# Patient Record
Sex: Female | Born: 1975 | Race: White | Hispanic: No | Marital: Married | Smoking: Never smoker
Health system: Southern US, Community
[De-identification: ages and names within clinical notes are randomized; demographics above are authoritative.]

## PROBLEM LIST (undated history)

## (undated) DIAGNOSIS — R51 Headache: Secondary | ICD-10-CM

## (undated) DIAGNOSIS — R519 Headache, unspecified: Secondary | ICD-10-CM

## (undated) DIAGNOSIS — F329 Major depressive disorder, single episode, unspecified: Secondary | ICD-10-CM

## (undated) DIAGNOSIS — E039 Hypothyroidism, unspecified: Secondary | ICD-10-CM

## (undated) DIAGNOSIS — G8929 Other chronic pain: Secondary | ICD-10-CM

## (undated) DIAGNOSIS — F32A Depression, unspecified: Secondary | ICD-10-CM

## (undated) DIAGNOSIS — T7840XA Allergy, unspecified, initial encounter: Secondary | ICD-10-CM

## (undated) DIAGNOSIS — F419 Anxiety disorder, unspecified: Secondary | ICD-10-CM

## (undated) HISTORY — DX: Headache, unspecified: R51.9

## (undated) HISTORY — DX: Anxiety disorder, unspecified: F41.9

## (undated) HISTORY — DX: Depression, unspecified: F32.A

## (undated) HISTORY — DX: Major depressive disorder, single episode, unspecified: F32.9

## (undated) HISTORY — DX: Other chronic pain: G89.29

## (undated) HISTORY — DX: Allergy, unspecified, initial encounter: T78.40XA

## (undated) HISTORY — DX: Headache: R51

## (undated) HISTORY — DX: Hypothyroidism, unspecified: E03.9

---

## 2003-05-19 ENCOUNTER — Inpatient Hospital Stay (HOSPITAL_COMMUNITY): Admission: AD | Admit: 2003-05-19 | Discharge: 2003-05-20 | Payer: Self-pay | Admitting: Obstetrics and Gynecology

## 2003-11-07 ENCOUNTER — Ambulatory Visit: Payer: Self-pay | Admitting: *Deleted

## 2003-11-07 ENCOUNTER — Inpatient Hospital Stay (HOSPITAL_COMMUNITY): Admission: AD | Admit: 2003-11-07 | Discharge: 2003-11-10 | Payer: Self-pay | Admitting: Obstetrics and Gynecology

## 2003-11-28 ENCOUNTER — Encounter: Admission: RE | Admit: 2003-11-28 | Discharge: 2003-12-28 | Payer: Self-pay | Admitting: Obstetrics and Gynecology

## 2003-12-07 ENCOUNTER — Other Ambulatory Visit: Admission: RE | Admit: 2003-12-07 | Discharge: 2003-12-07 | Payer: Self-pay | Admitting: Obstetrics & Gynecology

## 2005-02-16 ENCOUNTER — Other Ambulatory Visit: Admission: RE | Admit: 2005-02-16 | Discharge: 2005-02-16 | Payer: Self-pay | Admitting: Obstetrics & Gynecology

## 2005-09-22 ENCOUNTER — Inpatient Hospital Stay (HOSPITAL_COMMUNITY): Admission: AD | Admit: 2005-09-22 | Discharge: 2005-09-24 | Payer: Self-pay | Admitting: Obstetrics & Gynecology

## 2005-09-25 ENCOUNTER — Encounter: Admission: RE | Admit: 2005-09-25 | Discharge: 2005-10-25 | Payer: Self-pay | Admitting: Obstetrics and Gynecology

## 2009-05-19 DIAGNOSIS — R519 Headache, unspecified: Secondary | ICD-10-CM | POA: Insufficient documentation

## 2010-02-28 ENCOUNTER — Other Ambulatory Visit: Payer: Self-pay | Admitting: Family Medicine

## 2010-02-28 DIAGNOSIS — R51 Headache: Secondary | ICD-10-CM

## 2010-03-05 ENCOUNTER — Ambulatory Visit
Admission: RE | Admit: 2010-03-05 | Discharge: 2010-03-05 | Disposition: A | Discharge status: Home / Self Care | Payer: 59 | Source: Ambulatory Visit | Attending: Family Medicine | Admitting: Family Medicine

## 2010-03-05 DIAGNOSIS — R51 Headache: Secondary | ICD-10-CM

## 2011-06-08 ENCOUNTER — Other Ambulatory Visit: Payer: Self-pay | Admitting: Sports Medicine

## 2011-06-08 DIAGNOSIS — M25551 Pain in right hip: Secondary | ICD-10-CM

## 2011-06-19 ENCOUNTER — Ambulatory Visit
Admission: RE | Admit: 2011-06-19 | Discharge: 2011-06-19 | Disposition: A | Discharge status: Home / Self Care | Payer: 59 | Source: Ambulatory Visit | Attending: Sports Medicine | Admitting: Sports Medicine

## 2011-06-19 DIAGNOSIS — M25551 Pain in right hip: Secondary | ICD-10-CM

## 2011-06-19 MED ORDER — IOHEXOL 180 MG/ML  SOLN
13.0000 mL | Freq: Once | INTRAMUSCULAR | Status: AC | PRN
  Administered 2011-06-19: 13 mL via INTRA_ARTICULAR

## 2012-04-15 ENCOUNTER — Other Ambulatory Visit: Payer: Self-pay | Admitting: Obstetrics and Gynecology

## 2012-04-15 DIAGNOSIS — N6320 Unspecified lump in the left breast, unspecified quadrant: Secondary | ICD-10-CM

## 2012-04-27 ENCOUNTER — Ambulatory Visit
Admission: RE | Admit: 2012-04-27 | Discharge: 2012-04-27 | Disposition: A | Discharge status: Home / Self Care | Payer: 59 | Source: Ambulatory Visit | Attending: Obstetrics and Gynecology | Admitting: Obstetrics and Gynecology

## 2012-04-27 DIAGNOSIS — N6320 Unspecified lump in the left breast, unspecified quadrant: Secondary | ICD-10-CM

## 2015-08-09 ENCOUNTER — Ambulatory Visit: Payer: Self-pay | Admitting: Family Medicine

## 2015-08-16 ENCOUNTER — Encounter: Payer: Self-pay | Admitting: Family Medicine

## 2015-08-16 ENCOUNTER — Ambulatory Visit (INDEPENDENT_AMBULATORY_CARE_PROVIDER_SITE_OTHER): Payer: 59 | Admitting: Family Medicine

## 2015-08-16 DIAGNOSIS — Z862 Personal history of diseases of the blood and blood-forming organs and certain disorders involving the immune mechanism: Secondary | ICD-10-CM | POA: Diagnosis not present

## 2015-08-16 DIAGNOSIS — E039 Hypothyroidism, unspecified: Secondary | ICD-10-CM | POA: Insufficient documentation

## 2015-08-16 DIAGNOSIS — Z0000 Encounter for general adult medical examination without abnormal findings: Secondary | ICD-10-CM

## 2015-08-16 MED ORDER — NORGESTIMATE-ETH ESTRADIOL 0.25-35 MG-MCG PO TABS: 1.0000 | ORAL_TABLET | Freq: Every day | ORAL | 11 refills | Status: DC

## 2015-08-16 NOTE — Patient Instructions (Signed)
Great to meet you!  He will call with results within 1 week, if your TSH is low then I will decrease her Synthroid.  Please request her records from your GYN office so that we can have them in our chart.

## 2015-08-16 NOTE — Progress Notes (Signed)
   HPI  Patient presents today to establish care and discuss hypothyroidism.  Patient explains that she feels like her Synthroid may be too high. She has feelings of being "revved up", hot, sweaty, more anxious than usual, and similar to how she felt previously when her Synthroid dose was too high.  Patient has had recent increase in anxiety attacks. Previously she's tried trintellix which caused headaches and paxil which caused insomnia.   She had a Pap smear 1-1/2 years ago, no history of abnormal Pap smear.  She works out regularly  PMH: Smoking status noted Past medical history significant for chronic headaches, depression, hypothyroidism, anxiety Surgical history positive for cesarean section and 2005 Family history positive for Graves' disease and sister and mother.  ROS: Per HPI  Objective: BP 113/70   Pulse 63   Temp 97.8 F (36.6 C) (Oral)   Ht _0  (1.626 m)   Wt 140 lb 12.8 oz (63.9 kg)   BMI 24.17 kg/m  Gen: NAD, alert, cooperative with exam HEENT: NCAT, EOMI, PERRL oropharynx clear, nares clear  CV: RRR, good S1/S2, no murmur Resp: CTABL, no wheezes, non-labored Abd: SNTND, BS present, no guarding or organomegaly Ext: No edema, warm Neuro: Alert and oriented,  strength 5/5 and sensation intact in all 4 extremities, 2+ patellar reflexes bilaterally  Assessment and plan:  # Hypothyroidism Patient definitely has symptoms of hyperthyroidism indicating possible overtreatment TSH, consider decreasing dose based on the results. Also consider the possibility of underlying Graves' disease given her strong family history  Needs Brand name Synthroid   # Anxiety, depression First rule out medical causes, then consider medications. Consider Cymbalta  # Healthcare maintenance Basic labs ordered and request records     Orders Placed This Encounter  Procedures  . CMP14+EGFR  . CBC with Differential  . TSH  . Lipid panel  . Ferritin    Meds ordered this  encounter  Medications  . levothyroxine (SYNTHROID, LEVOTHROID) 125 MCG tablet    Sig: Take 125 mcg by mouth daily before breakfast.  . DISCONTD: norgestimate-ethinyl estradiol (ORTHO-CYCLEN,SPRINTEC,PREVIFEM) 0.25-35 MG-MCG tablet    Sig: Take 1 tablet by mouth daily.  . norgestimate-ethinyl estradiol (ORTHO-CYCLEN,SPRINTEC,PREVIFEM) 0.25-35 MG-MCG tablet    Sig: Take 1 tablet by mouth daily.    Dispense:  1 Package    Refill:  [place], MD [facility] 08/16/2015, 3:52 PM

## 2015-08-17 LAB — CBC WITH DIFFERENTIAL/PLATELET
BASOS ABS: 0 10*3/uL (ref 0.0–0.2)
Basos: 1 %
EOS (ABSOLUTE): 0.1 10*3/uL (ref 0.0–0.4)
Eos: 1 %
HEMOGLOBIN: 13.6 g/dL (ref 11.1–15.9)
Hematocrit: 40.7 % (ref 34.0–46.6)
Immature Grans (Abs): 0 10*3/uL (ref 0.0–0.1)
Immature Granulocytes: 0 %
LYMPHS ABS: 2.5 10*3/uL (ref 0.7–3.1)
Lymphs: 43 %
MCH: 30.8 pg (ref 26.6–33.0)
MCHC: 33.4 g/dL (ref 31.5–35.7)
MCV: 92 fL (ref 79–97)
MONOS ABS: 0.4 10*3/uL (ref 0.1–0.9)
Monocytes: 7 %
NEUTROS ABS: 2.8 10*3/uL (ref 1.4–7.0)
Neutrophils: 48 %
PLATELETS: 259 10*3/uL (ref 150–379)
RBC: 4.42 x10E6/uL (ref 3.77–5.28)
RDW: 12.4 % (ref 12.3–15.4)
WBC: 5.7 10*3/uL (ref 3.4–10.8)

## 2015-08-17 LAB — CMP14+EGFR
A/G RATIO: 1.6 (ref 1.2–2.2)
ALBUMIN: 4.6 g/dL (ref 3.5–5.5)
ALK PHOS: 55 IU/L (ref 39–117)
ALT: 11 IU/L (ref 0–32)
AST: 15 IU/L (ref 0–40)
BILIRUBIN TOTAL: 0.4 mg/dL (ref 0.0–1.2)
BUN / CREAT RATIO: 23 (ref 9–23)
BUN: 18 mg/dL (ref 6–20)
CHLORIDE: 99 mmol/L (ref 96–106)
CO2: 23 mmol/L (ref 18–29)
Calcium: 9.6 mg/dL (ref 8.7–10.2)
Creatinine, Ser: 0.77 mg/dL (ref 0.57–1.00)
GFR calc non Af Amer: 98 mL/min/{1.73_m2} (ref >59)
GFR, EST AFRICAN AMERICAN: 113 mL/min/{1.73_m2} (ref >59)
Globulin, Total: 2.8 g/dL (ref 1.5–4.5)
Glucose: 79 mg/dL (ref 65–99)
POTASSIUM: 4.4 mmol/L (ref 3.5–5.2)
SODIUM: 138 mmol/L (ref 134–144)
TOTAL PROTEIN: 7.4 g/dL (ref 6.0–8.5)

## 2015-08-17 LAB — LIPID PANEL
CHOL/HDL RATIO: 3.4 ratio (ref 0.0–4.4)
Cholesterol, Total: 239 mg/dL — ABNORMAL HIGH (ref 100–199)
HDL: 71 mg/dL (ref >39)
LDL CALC: 140 mg/dL — AB (ref 0–99)
TRIGLYCERIDES: 141 mg/dL (ref 0–149)
VLDL Cholesterol Cal: 28 mg/dL (ref 5–40)

## 2015-08-17 LAB — FERRITIN: Ferritin: 85 ng/mL (ref 15–150)

## 2015-08-17 LAB — TSH: TSH: 3.09 u[IU]/mL (ref 0.450–4.500)

## 2015-09-03 ENCOUNTER — Ambulatory Visit (INDEPENDENT_AMBULATORY_CARE_PROVIDER_SITE_OTHER): Payer: 59 | Admitting: Family Medicine

## 2015-09-03 ENCOUNTER — Encounter (INDEPENDENT_AMBULATORY_CARE_PROVIDER_SITE_OTHER): Payer: Self-pay

## 2015-09-03 ENCOUNTER — Encounter: Payer: Self-pay | Admitting: Family Medicine

## 2015-09-03 VITALS — BP 113/66 | HR 66 | Temp 97.1°F | Ht 64.0 in | Wt 143.0 lb

## 2015-09-03 DIAGNOSIS — F39 Unspecified mood [affective] disorder: Secondary | ICD-10-CM | POA: Diagnosis not present

## 2015-09-03 DIAGNOSIS — E039 Hypothyroidism, unspecified: Secondary | ICD-10-CM | POA: Diagnosis not present

## 2015-09-03 MED ORDER — LEVOTHYROXINE SODIUM 125 MCG PO TABS: 125.0000 ug | ORAL_TABLET | Freq: Every day | ORAL | 3 refills | Status: DC

## 2015-09-03 MED ORDER — ESCITALOPRAM OXALATE 10 MG PO TABS: ORAL_TABLET | ORAL | 0 refills | Status: DC

## 2015-09-03 NOTE — Progress Notes (Signed)
   HPI  Patient presents today here to follow-up on anxiety.  Patient explains that she's had recent issues with anxiety, panic episodes, sweating, and depression.  She was worried that it was her thyroid, however her thyroid testing was normal, and she had resolution of symptoms on vacation.  Patient explains she's tried several medications before including Lexapro, Celexa, trintellix, prozac, and zoloft  She sees a psychologist on a regular basis.  She does have symptoms of anhedonia.  Previously decreased libido, weight gain, and somnolence and/or sleep disturbance were part of her side effect.  PMH: Smoking status noted ROS: Per HPI  Objective: BP 113/66   Pulse 66   Temp 97.1 F (36.2 C) (Oral)   Ht 5\' 4"  (1.626 m)   Wt 143 lb (64.9 kg)   LMP 08/13/2015 (Approximate)   BMI 24.55 kg/m  Gen: NAD, alert, cooperative with exam HEENT: NCAT CV: RRR, good S1/S2, no murmur Resp: CTABL, no wheezes, non-labored Ext: No edema, warm Neuro: Alert and oriented, No gross deficits  Psych Appropriate mood and affect Denies SI  Assessment and plan:  # Mood disorder Mixed anxiety and depression Starting Lexapro, 5 mg 2 weeks, then increase to 10 mg. Follow-up 3-4 weeks No SI Continue meeting with psychology  # Hypothyroidism Stable Refill Synthroid   Meds ordered this encounter  Medications  . escitalopram (LEXAPRO) 10 MG tablet    Sig: Take one half tablet daily for 2 weeks, then increase to 1 tablet daily.    Dispense:  60 tablet    Refill:  0  . levothyroxine (SYNTHROID, LEVOTHROID) 125 MCG tablet    Sig: Take 1 tablet (125 mcg total) by mouth daily before breakfast.    Dispense:  90 tablet    Refill:  3    Murtis SinkSam Bradshaw, MD [facility] 09/03/2015, 8:54 AM

## 2015-09-03 NOTE — Patient Instructions (Signed)
Great to see see you!  Lets follow up in 3-4 weeks to see how your are doing.

## 2016-10-15 ENCOUNTER — Telehealth: Payer: Self-pay | Admitting: Family Medicine

## 2016-10-16 ENCOUNTER — Ambulatory Visit: Payer: 59 | Admitting: Family Medicine

## 2016-10-16 MED ORDER — NORGESTIMATE-ETH ESTRADIOL 0.25-35 MG-MCG PO TABS: 1.0000 | ORAL_TABLET | Freq: Every day | ORAL | 0 refills | Status: DC

## 2016-10-16 NOTE — Telephone Encounter (Signed)
LMOVM refill sent to pharmacy 

## 2016-10-26 ENCOUNTER — Ambulatory Visit: Payer: Self-pay | Admitting: Family Medicine

## 2016-11-02 ENCOUNTER — Telehealth: Payer: Self-pay | Admitting: Family Medicine

## 2016-11-02 ENCOUNTER — Encounter: Payer: Self-pay | Admitting: Family Medicine

## 2016-11-02 ENCOUNTER — Ambulatory Visit (INDEPENDENT_AMBULATORY_CARE_PROVIDER_SITE_OTHER): Payer: BC Managed Care – PPO | Admitting: Family Medicine

## 2016-11-02 VITALS — BP 102/66 | HR 55 | Temp 97.2°F | Ht 64.0 in | Wt 146.2 lb

## 2016-11-02 DIAGNOSIS — E039 Hypothyroidism, unspecified: Secondary | ICD-10-CM

## 2016-11-02 DIAGNOSIS — Z309 Encounter for contraceptive management, unspecified: Secondary | ICD-10-CM | POA: Diagnosis not present

## 2016-11-02 DIAGNOSIS — F39 Unspecified mood [affective] disorder: Secondary | ICD-10-CM

## 2016-11-02 MED ORDER — LEVOTHYROXINE SODIUM 125 MCG PO TABS: 125.0000 ug | ORAL_TABLET | Freq: Every day | ORAL | 3 refills | Status: DC

## 2016-11-02 MED ORDER — NORGESTIM-ETH ESTRAD TRIPHASIC 0.18/0.215/0.25 MG-35 MCG PO TABS: 1.0000 | ORAL_TABLET | Freq: Every day | ORAL | 3 refills | Status: DC

## 2016-11-02 MED ORDER — SYNTHROID 125 MCG PO TABS: 125.0000 ug | ORAL_TABLET | Freq: Every day | ORAL | 3 refills | Status: DC

## 2016-11-02 MED ORDER — QUETIAPINE FUMARATE 50 MG PO TABS: 50.0000 mg | ORAL_TABLET | Freq: Every day | ORAL | 3 refills | Status: DC

## 2016-11-02 NOTE — Telephone Encounter (Signed)
Pt read side effects and was worried, I explained that all meds have side effects, she will try med and let us know how it is working

## 2016-11-02 NOTE — Progress Notes (Signed)
   HPI  Patient presents today here for follow-up of mood disorder, hypothyroidism, and discussion about contraception.  Contraception Sprintec causes breakout for her, she would like tri-Sprintec  Mood disorder Patient with primarily anxiety. Denies SI. Patient states that Lexapro caused headaches and worsening insomnia. Patient reports longtime history of difficulty sleeping. She is tried multiple medications including Zoloft, Lexapro, Effexor, and trintellix.  She is open to trying more things. She is currently seeking psychological help with CBT  Hypothyroidism Patient is fasting today. She needs refill She would like brand name synthroid.    PMH: Smoking status noted ROS: Per HPI  Objective: BP 102/66   Pulse (!) 55   Temp (!) 97.2 F (36.2 C) (Oral)   Ht _0  (1.626 m)   Wt 146 lb 3.2 oz (66.3 kg)   BMI 25.10 kg/m  Gen: NAD, alert, cooperative with exam HEENT: NCAT CV: RRR, good S1/S2, no murmur Resp: CTABL, no wheezes, non-labored Ext: No edema, warm Neuro: Alert and oriented, No gross deficits  Assessment and plan:  # Mood disorder Uncontrolled. Primarily anxiety. No SI. Patient is tried multiple medications, will try Seroquel for sleep and anxiety. Route follow-up in 3-4 weeks. Low-dose given previous difficulty tolerating medications Discussed possible metabolic side effects  # Hypothyroidism Repeat labs Refill Synthroid Clinically stable  #contraceptive management Refill Tri-Sprintec,  She has done well on this in the past    Orders Placed This Encounter  Procedures  . Lipid panel  . CMP14+EGFR  . CBC with Differential/Platelet  . TSH    Meds ordered this encounter  Medications  . DISCONTD: Lenard Forth Triphasic (TRI-SPRINTEC PO)    Sig: Tri-Sprintec (28) 0.18 mg(7)/0.215 mg(7)/0.25 mg(7)-35 mcg tablet  TAKE ONE TABLET BY MOUTH ONCE DAILY  . levothyroxine (SYNTHROID, LEVOTHROID) 125 MCG tablet    Sig: Take 1 tablet (125  mcg total) by mouth daily before breakfast.    Dispense:  90 tablet    Refill:  3  . QUEtiapine (SEROQUEL) 50 MG tablet    Sig: Take 1 tablet (50 mg total) by mouth at bedtime.    Dispense:  30 tablet    Refill:  3  . Norgestimate-Ethinyl Estradiol Triphasic (TRI-SPRINTEC) 0.18/0.215/0.25 MG-35 MCG tablet    Sig: Take 1 tablet by mouth daily.    Dispense:  3 Package    Refill:  [place], MD [facility] 11/02/2016, 10:07 AM

## 2016-11-02 NOTE — Patient Instructions (Signed)
Great to see you!  Come back in about 1 month.   Start seroquel 1 pill once daily at night.

## 2016-11-03 LAB — CBC WITH DIFFERENTIAL/PLATELET
BASOS ABS: 0 10*3/uL (ref 0.0–0.2)
Basos: 0 %
EOS (ABSOLUTE): 0.1 10*3/uL (ref 0.0–0.4)
Eos: 2 %
Hematocrit: 39.9 % (ref 34.0–46.6)
Hemoglobin: 13.3 g/dL (ref 11.1–15.9)
IMMATURE GRANS (ABS): 0 10*3/uL (ref 0.0–0.1)
IMMATURE GRANULOCYTES: 0 %
LYMPHS: 35 %
Lymphocytes Absolute: 1.7 10*3/uL (ref 0.7–3.1)
MCH: 30.8 pg (ref 26.6–33.0)
MCHC: 33.3 g/dL (ref 31.5–35.7)
MCV: 92 fL (ref 79–97)
MONOS ABS: 0.3 10*3/uL (ref 0.1–0.9)
Monocytes: 6 %
NEUTROS PCT: 57 %
Neutrophils Absolute: 2.6 10*3/uL (ref 1.4–7.0)
PLATELETS: 253 10*3/uL (ref 150–379)
RBC: 4.32 x10E6/uL (ref 3.77–5.28)
RDW: 11.9 % — AB (ref 12.3–15.4)
WBC: 4.7 10*3/uL (ref 3.4–10.8)

## 2016-11-03 LAB — LIPID PANEL
CHOL/HDL RATIO: 3 ratio (ref 0.0–4.4)
Cholesterol, Total: 181 mg/dL (ref 100–199)
HDL: 61 mg/dL (ref >39)
LDL CALC: 92 mg/dL (ref 0–99)
Triglycerides: 140 mg/dL (ref 0–149)
VLDL Cholesterol Cal: 28 mg/dL (ref 5–40)

## 2016-11-03 LAB — CMP14+EGFR
A/G RATIO: 1.8 (ref 1.2–2.2)
ALK PHOS: 46 IU/L (ref 39–117)
ALT: 11 IU/L (ref 0–32)
AST: 16 IU/L (ref 0–40)
Albumin: 4.3 g/dL (ref 3.5–5.5)
BUN/Creatinine Ratio: 12 (ref 9–23)
BUN: 11 mg/dL (ref 6–24)
Bilirubin Total: 0.5 mg/dL (ref 0.0–1.2)
CALCIUM: 9.2 mg/dL (ref 8.7–10.2)
CHLORIDE: 105 mmol/L (ref 96–106)
CO2: 22 mmol/L (ref 20–29)
Creatinine, Ser: 0.93 mg/dL (ref 0.57–1.00)
GFR calc Af Amer: 89 mL/min/{1.73_m2} (ref >59)
GFR, EST NON AFRICAN AMERICAN: 77 mL/min/{1.73_m2} (ref >59)
Globulin, Total: 2.4 g/dL (ref 1.5–4.5)
Glucose: 80 mg/dL (ref 65–99)
POTASSIUM: 4.6 mmol/L (ref 3.5–5.2)
SODIUM: 140 mmol/L (ref 134–144)
Total Protein: 6.7 g/dL (ref 6.0–8.5)

## 2016-11-03 LAB — TSH: TSH: 0.767 u[IU]/mL (ref 0.450–4.500)

## 2016-11-30 ENCOUNTER — Ambulatory Visit: Payer: BC Managed Care – PPO | Admitting: Family Medicine

## 2016-12-21 ENCOUNTER — Encounter: Payer: Self-pay | Admitting: Family Medicine

## 2016-12-21 ENCOUNTER — Ambulatory Visit: Payer: BC Managed Care – PPO | Admitting: Family Medicine

## 2016-12-22 ENCOUNTER — Encounter: Payer: Self-pay | Admitting: Family Medicine

## 2017-03-15 DIAGNOSIS — M25571 Pain in right ankle and joints of right foot: Secondary | ICD-10-CM | POA: Diagnosis not present

## 2017-04-05 DIAGNOSIS — L720 Epidermal cyst: Secondary | ICD-10-CM | POA: Diagnosis not present

## 2017-05-31 ENCOUNTER — Ambulatory Visit: Payer: Self-pay | Admitting: Family Medicine

## 2017-06-02 ENCOUNTER — Encounter: Payer: Self-pay | Admitting: Family Medicine

## 2017-06-09 ENCOUNTER — Other Ambulatory Visit: Payer: Self-pay | Admitting: Family Medicine

## 2017-08-18 ENCOUNTER — Encounter: Payer: Self-pay | Admitting: Family Medicine

## 2017-08-19 ENCOUNTER — Ambulatory Visit (INDEPENDENT_AMBULATORY_CARE_PROVIDER_SITE_OTHER): Payer: 59 | Admitting: Family

## 2017-08-19 ENCOUNTER — Encounter: Payer: Self-pay | Admitting: Family

## 2017-08-19 VITALS — BP 121/88 | HR 67 | Temp 97.6°F | Ht 64.0 in | Wt 150.0 lb

## 2017-08-19 DIAGNOSIS — F410 Panic disorder [episodic paroxysmal anxiety] without agoraphobia: Secondary | ICD-10-CM | POA: Insufficient documentation

## 2017-08-19 DIAGNOSIS — F411 Generalized anxiety disorder: Secondary | ICD-10-CM | POA: Diagnosis not present

## 2017-08-19 MED ORDER — ALPRAZOLAM 0.25 MG PO TABS
0.2500 mg | ORAL_TABLET | Freq: Two times a day (BID) | ORAL | 0 refills | Status: DC | PRN
Start: 2017-08-19 — End: 2017-11-02

## 2017-08-19 NOTE — Patient Instructions (Signed)
Panic Attack A panic attack is a sudden episode of severe anxiety, fear, or discomfort that causes physical and emotional symptoms. The attack may be in response to something frightening, or it may occur for no known reason. Symptoms of a panic attack can be similar to symptoms of a heart attack or stroke. It is important to see your health care provider when you have a panic attack so that these conditions can be ruled out. A panic attack is a symptom of another condition. Most panic attacks go away with treatment of the underlying problem. If you have panic attacks often, you may have a condition called panic disorder. What are the causes? A panic attack may be caused by:  An extreme, life-threatening situation, such as a war or natural disaster.  An anxiety disorder, such as post-traumatic stress disorder.  Depression.  Certain medical conditions, including heart problems, neurological conditions, and infections.  Certain over-the-counter and prescription medicines.  Illegal drugs that increase heart rate and blood pressure, such as methamphetamine.  Alcohol.  Supplements that increase anxiety.  Panic disorder.  What increases the risk? You are more likely to develop this condition if:  You have an anxiety disorder.  You have another mental health condition.  You take certain medicines.  You use alcohol, illegal drugs, or other substances.  You are under extreme stress.  A life event is causing increased feelings of anxiety and depression.  What are the signs or symptoms? A panic attack starts suddenly, usually lasts about 20 minutes, and occurs with one or more of the following:  A pounding heart.  A feeling that your heart is beating irregularly or faster than normal (palpitations).  Sweating.  Trembling or shaking.  Shortness of breath or feeling smothered.  Feeling choked.  Chest pain or discomfort.  Nausea or a strange feeling in your  stomach.  Dizziness, feeling lightheaded, or feeling like you might faint.  Chills or hot flashes.  Numbness or tingling in your lips, hands, or feet.  Feeling confused, or feeling that you are not yourself.  Fear of losing control or being emotionally unstable.  Fear of dying.  How is this diagnosed? A panic attack is diagnosed with an assessment by your health care provider. During the assessment your health care provider will ask questions about:  Your history of anxiety, depression, and panic attacks.  Your medical history.  Whether you drink alcohol, use illegal drugs, take supplements, or take medicines. Be honest about your substance use.  Your health care provider may also:  Order blood tests or other kinds of tests to rule out serious medical conditions.  Refer you to a mental health professional for further evaluation.  How is this treated? Treatment depends on the cause of the panic attack:  If the cause is a medical problem, your health care provider will either treat that problem or refer you to a specialist.  If the cause is emotional, you may be given anti-anxiety medicines or referred to a counselor. These medicines may reduce how often attacks happen, reduce how severe the attacks are, and lower anxiety.  If the cause is a medicine, your health care provider may tell you to stop the medicine, change your dose, or take a different medicine.  If the cause is a drug, treatment may involve letting the drug wear off and taking medicine to help the drug leave your body or to counteract its effects. Attacks caused by drug abuse may continue even if you stop using   the drug.  Follow these instructions at home:  Take over-the-counter and prescription medicines only as told by your health care provider.  If you feel anxious, limit your caffeine intake.  Take good care of your physical and mental health by: ? Eating a balanced diet that includes plenty of fresh  fruits and vegetables, whole grains, lean meats, and low-fat dairy. ? Getting plenty of rest. Try to get 7-8 hours of uninterrupted sleep each night. ? Exercising regularly. Try to get 30 minutes of physical activity at least 5 days a week. ? Not smoking. Talk to your health care provider if you need help quitting. ? Limiting alcohol intake to no more than 1 drink a day for nonpregnant women and 2 drinks a day for men. One drink equals 12 oz of beer, 5 oz of wine, or 1 oz of hard liquor.  Keep all follow-up visits as told by your health care provider. This is important. Panic attacks may have underlying physical or emotional problems that take time to accurately diagnose. Contact a health care provider if:  Your symptoms do not improve, or they get worse.  You are not able to take your medicine as prescribed because of side effects. Get help right away if:  You have serious thoughts about hurting yourself or others.  You have symptoms of a panic attack. Do not drive yourself to the hospital. Have someone else drive you or call an ambulance. If you ever feel like you may hurt yourself or others, or you have thoughts about taking your own life, get help right away. You can go to your nearest emergency department or call:  Your local emergency services (911 in the [place]  A suicide crisis helpline, such as the National Suicide Prevention Lifeline at 1-800-273-8255. This is open 24 hours a day.  Summary  A panic attack is a sign of a serious health or mental health condition. Get help right away. Do not drive yourself to the hospital. Have someone else drive you or call an ambulance.  Always see a health care provider to have the reasons for the panic attack correctly diagnosed.  If your panic attack was caused by a physical problem, follow your health care provider's suggestions for medicine, referral to a specialist, and lifestyle changes.  If your panic attack was caused by an  emotional problem, follow through with counseling from a qualified mental health specialist.  If you feel like you may hurt yourself or others, call 911 and get help right away. This information is not intended to replace advice given to you by your health care provider. Make sure you discuss any questions you have with your health care provider. Document Released: 01/05/2005 Document Revised: 02/14/2016 Document Reviewed: 02/14/2016 Elsevier Interactive Patient Education  2018 Elsevier Inc.  

## 2017-08-19 NOTE — Progress Notes (Signed)
   Subjective:    Patient ID: Michelle StanfordBrandy S Hollis, female    DOB: 09/15/1975, 42 y.o.   MRN: [id number]  Chief Complaint  Patient presents with  . anxiety about flying   PT presents to the office today for panic attacks. Pt states she is leaving tomorrow on flight to [place]. Pt states she is very nervous about flying, but she is taking her kids with her which is causing "extra anxiety". She is worried she will have a panic attack.   Anxiety  Presents for follow-up visit. Symptoms include excessive worry, irritability, nervous/anxious behavior, panic and restlessness. Symptoms occur occasionally. The severity of symptoms is moderate.        Review of Systems  Constitutional: Positive for irritability.  Psychiatric/Behavioral: The patient is nervous/anxious.   All other systems reviewed and are negative.      Objective:   Physical Exam  Constitutional: She is oriented to person, place, and time. She appears well-developed and well-nourished. No distress.  HENT:  Head: Normocephalic and atraumatic.  Eyes: Pupils are equal, round, and reactive to light.  Neck: Normal range of motion. Neck supple. No thyromegaly present.  Cardiovascular: Normal rate, regular rhythm, normal heart sounds and intact distal pulses.  No murmur heard. Pulmonary/Chest: Effort normal and breath sounds normal. No respiratory distress. She has no wheezes.  Abdominal: Soft. Bowel sounds are normal. She exhibits no distension. There is no tenderness.  Musculoskeletal: Normal range of motion. She exhibits no edema or tenderness.  Neurological: She is alert and oriented to person, place, and time. She has normal reflexes. No cranial nerve deficit.  Skin: Skin is warm and dry.  Psychiatric: She has a normal mood and affect. Her behavior is normal. Judgment and thought content normal.  Vitals reviewed.    BP 121/88   Pulse 67   Temp 97.6 F (36.4 C) (Oral)   Ht 5\' 4"  (1.626 m)   Wt 150 lb (68 kg)   BMI  25.75 kg/m      Assessment & Plan:  1. Panic attack - ALPRAZolam (XANAX) 0.25 MG tablet; Take 1 tablet (0.25 mg total) by mouth 2 (two) times daily as needed for anxiety.  Dispense: 30 tablet; Refill: 0  2. GAD (generalized anxiety disorder) - ALPRAZolam (XANAX) 0.25 MG tablet; Take 1 tablet (0.25 mg total) by mouth 2 (two) times daily as needed for anxiety.  Dispense: 30 tablet; Refill: 0   We will give rx for xanax to take on flight. Discussed we can only prescribe for panic attack as needed. Pt reviewed in [place] controlled database. Pt has not received any controlled medications in several years.   Jannifer Rodneyhristy Ariely Riddell, FNP

## 2017-09-22 MED ORDER — NORGESTIM-ETH ESTRAD TRIPHASIC 0.18/0.215/0.25 MG-35 MCG PO TABS: 1.0000 | ORAL_TABLET | Freq: Every day | ORAL | 0 refills | Status: DC

## 2017-10-25 ENCOUNTER — Encounter: Payer: 59 | Admitting: Family

## 2017-11-02 ENCOUNTER — Ambulatory Visit (INDEPENDENT_AMBULATORY_CARE_PROVIDER_SITE_OTHER): Payer: 59 | Admitting: Family

## 2017-11-02 ENCOUNTER — Encounter: Payer: Self-pay | Admitting: Family

## 2017-11-02 VITALS — BP 119/77 | HR 70 | Temp 97.4°F | Ht 64.0 in | Wt 146.2 lb

## 2017-11-02 DIAGNOSIS — Z3041 Encounter for surveillance of contraceptive pills: Secondary | ICD-10-CM

## 2017-11-02 DIAGNOSIS — Z01419 Encounter for gynecological examination (general) (routine) without abnormal findings: Secondary | ICD-10-CM

## 2017-11-02 DIAGNOSIS — F411 Generalized anxiety disorder: Secondary | ICD-10-CM

## 2017-11-02 DIAGNOSIS — F39 Unspecified mood [affective] disorder: Secondary | ICD-10-CM

## 2017-11-02 DIAGNOSIS — E039 Hypothyroidism, unspecified: Secondary | ICD-10-CM

## 2017-11-02 DIAGNOSIS — F410 Panic disorder [episodic paroxysmal anxiety] without agoraphobia: Secondary | ICD-10-CM

## 2017-11-02 DIAGNOSIS — Z23 Encounter for immunization: Secondary | ICD-10-CM

## 2017-11-02 DIAGNOSIS — Z0000 Encounter for general adult medical examination without abnormal findings: Secondary | ICD-10-CM | POA: Diagnosis not present

## 2017-11-02 LAB — URINALYSIS
Bilirubin, UA: NEGATIVE
GLUCOSE, UA: NEGATIVE
KETONES UA: NEGATIVE
LEUKOCYTES UA: NEGATIVE
Nitrite, UA: NEGATIVE
Protein, UA: NEGATIVE
RBC, UA: NEGATIVE
Specific Gravity, UA: 1.015 (ref 1.005–1.030)
Urobilinogen, Ur: 0.2 mg/dL (ref 0.2–1.0)
pH, UA: 8 — ABNORMAL HIGH (ref 5.0–7.5)

## 2017-11-02 MED ORDER — NORGESTIM-ETH ESTRAD TRIPHASIC 0.18/0.215/0.25 MG-35 MCG PO TABS: 1.0000 | ORAL_TABLET | Freq: Every day | ORAL | 4 refills | Status: DC

## 2017-11-02 MED ORDER — VORTIOXETINE HBR 10 MG PO TABS: 10.0000 mg | ORAL_TABLET | Freq: Every day | ORAL | 1 refills | Status: DC

## 2017-11-02 MED ORDER — SYNTHROID 125 MCG PO TABS: 125.0000 ug | ORAL_TABLET | Freq: Every day | ORAL | 4 refills | Status: DC

## 2017-11-02 MED ORDER — ALPRAZOLAM 0.25 MG PO TABS: 0.2500 mg | ORAL_TABLET | Freq: Two times a day (BID) | ORAL | 0 refills | Status: DC | PRN

## 2017-11-02 NOTE — Addendum Note (Signed)
Addended by: Almeta Monas on: 11/02/2017 04:38 PM   Modules accepted: Orders

## 2017-11-02 NOTE — Patient Instructions (Signed)

## 2017-11-02 NOTE — Progress Notes (Signed)
Subjective:    Patient ID: Michelle Delgado, female    DOB: 1975-06-19, 42 y.o.   MRN: [id number]  Chief Complaint  Patient presents with  . Gynecologic Exam    and pap   PT presents to the office today CPE with pap. Requesting refill on OC. She does not smoke.  Thyroid Problem  Presents for follow-up visit. Symptoms include anxiety, constipation, depressed mood and fatigue. Patient reports no diarrhea. The symptoms have been stable.  Anxiety  Presents for follow-up visit. Symptoms include depressed mood, excessive worry, insomnia, irritability, nervous/anxious behavior, panic and restlessness. Symptoms occur most days. The severity of symptoms is moderate.    Gynecologic Exam  The patient's pertinent negatives include no genital itching, genital odor or vaginal discharge. The patient is experiencing no pain. Associated symptoms include constipation. Pertinent negatives include no diarrhea.      Review of Systems  Constitutional: Positive for fatigue and irritability.  Gastrointestinal: Positive for constipation. Negative for diarrhea.  Genitourinary: Negative for vaginal discharge.  Psychiatric/Behavioral: The patient is nervous/anxious and has insomnia.   All other systems reviewed and are negative.      Objective:   Physical Exam  Constitutional: She is oriented to person, place, and time. She appears well-developed and well-nourished. No distress.  HENT:  Head: Normocephalic and atraumatic.  Right Ear: External ear normal.  Left Ear: External ear normal.  Mouth/Throat: Oropharynx is clear and moist.  Eyes: Pupils are equal, round, and reactive to light.  Neck: Normal range of motion. Neck supple. No thyromegaly present.  Cardiovascular: Normal rate, regular rhythm, normal heart sounds and intact distal pulses.  No murmur heard. Pulmonary/Chest: Effort normal and breath sounds normal. No respiratory distress. She has no wheezes. Right breast exhibits no inverted nipple,  no mass, no nipple discharge, no skin change and no tenderness. Left breast exhibits no inverted nipple, no mass, no nipple discharge, no skin change and no tenderness.  Abdominal: Soft. Bowel sounds are normal. She exhibits no distension. There is no tenderness.  Genitourinary: Vagina normal.  Genitourinary Comments: Bimanual exam- no adnexal masses or tenderness, ovaries nonpalpable   Cervix parous and pink- No discharge   Musculoskeletal: Normal range of motion. She exhibits no edema or tenderness.  Neurological: She is alert and oriented to person, place, and time. She has normal reflexes. No cranial nerve deficit.  Skin: Skin is warm and dry.  Psychiatric: She has a normal mood and affect. Her behavior is normal. Judgment and thought content normal.  Vitals reviewed.     BP 119/77   Pulse 70   Temp (!) 97.4 F (36.3 C) (Oral)   Ht _0  (1.626 m)   Wt 146 lb 3.2 oz (66.3 kg)   LMP 10/24/2017   BMI 25.10 kg/m      Assessment & Plan:  Michelle Delgado comes in today with chief complaint of Gynecologic Exam (and pap)   Diagnosis and orders addressed:  1. Annual physical exam - CMP14+EGFR - CBC with Differential/Platelet - Lipid panel - TSH - IGP, Aptima HPV, rfx 16/18,45  2. Gynecologic exam normal - Urinalysis - CMP14+EGFR - CBC with Differential/Platelet - IGP, Aptima HPV, rfx 16/18,45  3. Hypothyroidism, unspecified type - CMP14+EGFR - CBC with Differential/Platelet - TSH - SYNTHROID 125 MCG tablet; Take 1 tablet (125 mcg total) by mouth daily.  Dispense: 90 tablet; Refill: 4  4. Mood disorder (HCC) - CMP14+EGFR - CBC with Differential/Platelet - vortioxetine HBr (TRINTELLIX) 10 MG TABS tablet; Take  1 tablet (10 mg total) by mouth daily.  Dispense: 90 tablet; Refill: 1  5. Panic attack - CMP14+EGFR - CBC with Differential/Platelet - ALPRAZolam (XANAX) 0.25 MG tablet; Take 1 tablet (0.25 mg total) by mouth 2 (two) times daily as needed for anxiety.   Dispense: 30 tablet; Refill: 0 - vortioxetine HBr (TRINTELLIX) 10 MG TABS tablet; Take 1 tablet (10 mg total) by mouth daily.  Dispense: 90 tablet; Refill: 1  6. GAD (generalized anxiety disorder)  - ALPRAZolam (XANAX) 0.25 MG tablet; Take 1 tablet (0.25 mg total) by mouth 2 (two) times daily as needed for anxiety.  Dispense: 30 tablet; Refill: 0 - vortioxetine HBr (TRINTELLIX) 10 MG TABS tablet; Take 1 tablet (10 mg total) by mouth daily.  Dispense: 90 tablet; Refill: 1  7. Encounter for birth control pills maintenance - Norgestimate-Ethinyl Estradiol Triphasic (TRI-SPRINTEC) 0.18/0.215/0.25 MG-35 MCG tablet; Take 1 tablet by mouth daily.  Dispense: 3 Package; Refill: 4   Labs pending Health Maintenance reviewed- Pt to schedule mammogram Diet and exercise encouraged  Follow up plan: 6 weeks to recheck GAD  Evelina Dun, FNP

## 2017-11-03 LAB — CBC WITH DIFFERENTIAL/PLATELET
Basophils Absolute: 0 10*3/uL (ref 0.0–0.2)
Basos: 1 %
EOS (ABSOLUTE): 0.1 10*3/uL (ref 0.0–0.4)
EOS: 3 %
HEMATOCRIT: 42.2 % (ref 34.0–46.6)
HEMOGLOBIN: 13.8 g/dL (ref 11.1–15.9)
IMMATURE GRANS (ABS): 0 10*3/uL (ref 0.0–0.1)
Immature Granulocytes: 0 %
LYMPHS ABS: 1.8 10*3/uL (ref 0.7–3.1)
LYMPHS: 40 %
MCH: 30.5 pg (ref 26.6–33.0)
MCHC: 32.7 g/dL (ref 31.5–35.7)
MCV: 93 fL (ref 79–97)
MONOCYTES: 8 %
Monocytes Absolute: 0.4 10*3/uL (ref 0.1–0.9)
NEUTROS ABS: 2.2 10*3/uL (ref 1.4–7.0)
Neutrophils: 48 %
Platelets: 303 10*3/uL (ref 150–450)
RBC: 4.53 x10E6/uL (ref 3.77–5.28)
RDW: 11.7 % — AB (ref 12.3–15.4)
WBC: 4.5 10*3/uL (ref 3.4–10.8)

## 2017-11-03 LAB — LIPID PANEL
CHOL/HDL RATIO: 3.3 ratio (ref 0.0–4.4)
Cholesterol, Total: 259 mg/dL — ABNORMAL HIGH (ref 100–199)
HDL: 78 mg/dL (ref >39)
LDL CALC: 152 mg/dL — AB (ref 0–99)
TRIGLYCERIDES: 145 mg/dL (ref 0–149)
VLDL Cholesterol Cal: 29 mg/dL (ref 5–40)

## 2017-11-03 LAB — CMP14+EGFR
ALBUMIN: 4.6 g/dL (ref 3.5–5.5)
ALT: 12 IU/L (ref 0–32)
AST: 16 IU/L (ref 0–40)
Albumin/Globulin Ratio: 1.8 (ref 1.2–2.2)
Alkaline Phosphatase: 51 IU/L (ref 39–117)
BUN / CREAT RATIO: 17 (ref 9–23)
BUN: 12 mg/dL (ref 6–24)
Bilirubin Total: 0.4 mg/dL (ref 0.0–1.2)
CO2: 22 mmol/L (ref 20–29)
CREATININE: 0.71 mg/dL (ref 0.57–1.00)
Calcium: 9.5 mg/dL (ref 8.7–10.2)
Chloride: 102 mmol/L (ref 96–106)
GFR calc non Af Amer: 106 mL/min/{1.73_m2} (ref >59)
GFR, EST AFRICAN AMERICAN: 122 mL/min/{1.73_m2} (ref >59)
GLUCOSE: 68 mg/dL (ref 65–99)
Globulin, Total: 2.6 g/dL (ref 1.5–4.5)
Potassium: 4.3 mmol/L (ref 3.5–5.2)
Sodium: 141 mmol/L (ref 134–144)
TOTAL PROTEIN: 7.2 g/dL (ref 6.0–8.5)

## 2017-11-03 LAB — TSH: TSH: 0.962 u[IU]/mL (ref 0.450–4.500)

## 2017-11-05 LAB — IGP, APTIMA HPV, RFX 16/18,45
HPV Aptima: NEGATIVE
PAP SMEAR COMMENT: 0

## 2017-11-30 DIAGNOSIS — Z1231 Encounter for screening mammogram for malignant neoplasm of breast: Secondary | ICD-10-CM | POA: Diagnosis not present

## 2017-12-07 ENCOUNTER — Other Ambulatory Visit: Payer: Self-pay | Admitting: Family

## 2017-12-07 DIAGNOSIS — N6310 Unspecified lump in the right breast, unspecified quadrant: Secondary | ICD-10-CM

## 2017-12-14 ENCOUNTER — Ambulatory Visit: Payer: 59 | Admitting: Family

## 2017-12-28 ENCOUNTER — Ambulatory Visit (HOSPITAL_COMMUNITY): Payer: 59

## 2017-12-28 ENCOUNTER — Ambulatory Visit (HOSPITAL_COMMUNITY)
Admission: RE | Admit: 2017-12-28 | Discharge: 2017-12-28 | Disposition: A | Discharge status: Home / Self Care | Payer: 59 | Source: Ambulatory Visit | Attending: Family | Admitting: Family

## 2017-12-28 ENCOUNTER — Encounter (HOSPITAL_COMMUNITY): Payer: 59

## 2017-12-28 DIAGNOSIS — N6310 Unspecified lump in the right breast, unspecified quadrant: Secondary | ICD-10-CM | POA: Insufficient documentation

## 2017-12-28 DIAGNOSIS — R928 Other abnormal and inconclusive findings on diagnostic imaging of breast: Secondary | ICD-10-CM | POA: Diagnosis not present

## 2018-01-04 ENCOUNTER — Other Ambulatory Visit (HOSPITAL_COMMUNITY): Payer: 59

## 2018-01-04 ENCOUNTER — Encounter (HOSPITAL_COMMUNITY): Payer: 59

## 2018-02-07 ENCOUNTER — Ambulatory Visit: Payer: 59 | Admitting: Family

## 2018-02-07 ENCOUNTER — Encounter: Payer: Self-pay | Admitting: Family

## 2018-02-07 VITALS — BP 110/73 | HR 86 | Temp 97.3°F | Ht 64.0 in | Wt 146.0 lb

## 2018-02-07 DIAGNOSIS — F410 Panic disorder [episodic paroxysmal anxiety] without agoraphobia: Secondary | ICD-10-CM | POA: Diagnosis not present

## 2018-02-07 DIAGNOSIS — L659 Nonscarring hair loss, unspecified: Secondary | ICD-10-CM | POA: Diagnosis not present

## 2018-02-07 DIAGNOSIS — F39 Unspecified mood [affective] disorder: Secondary | ICD-10-CM

## 2018-02-07 DIAGNOSIS — E039 Hypothyroidism, unspecified: Secondary | ICD-10-CM

## 2018-02-07 DIAGNOSIS — R5383 Other fatigue: Secondary | ICD-10-CM

## 2018-02-07 MED ORDER — VORTIOXETINE HBR 20 MG PO TABS: 20.0000 mg | ORAL_TABLET | Freq: Every day | ORAL | 4 refills | Status: DC

## 2018-02-07 NOTE — Progress Notes (Signed)
Subjective:    Patient ID: Michelle Delgado, female    DOB: 11/10/1975, 43 y.o.   MRN: [id number]  Chief Complaint  Patient presents with  . Anxiety    three months recheck   PT presents to the office today to recheck Mood disorder. She was started on Trintellix 10 mg she states she has felt much better.  Anxiety  Presents for follow-up visit. Symptoms include depressed mood, excessive worry, nervous/anxious behavior and restlessness. Patient reports no decreased concentration or irritability. Symptoms occur most days. The severity of symptoms is moderate. The quality of sleep is good.    Thyroid Problem  Presents for follow-up visit. Symptoms include anxiety, constipation, depressed mood, fatigue and hair loss. The symptoms have been stable.      Review of Systems  Constitutional: Positive for fatigue. Negative for irritability.  Gastrointestinal: Positive for constipation.  Psychiatric/Behavioral: Negative for decreased concentration. The patient is nervous/anxious.   All other systems reviewed and are negative.      Objective:   Physical Exam Vitals signs reviewed.  Constitutional:      General: She is not in acute distress.    Appearance: She is well-developed.  HENT:     Head: Normocephalic and atraumatic.     Right Ear: Tympanic membrane normal.     Left Ear: Tympanic membrane normal.  Eyes:     Pupils: Pupils are equal, round, and reactive to light.  Neck:     Musculoskeletal: Normal range of motion and neck supple.     Thyroid: No thyromegaly.  Cardiovascular:     Rate and Rhythm: Normal rate and regular rhythm.     Heart sounds: Normal heart sounds. No murmur.  Pulmonary:     Effort: Pulmonary effort is normal. No respiratory distress.     Breath sounds: Normal breath sounds. No wheezing.  Abdominal:     General: Bowel sounds are normal. There is no distension.     Palpations: Abdomen is soft.     Tenderness: There is no abdominal tenderness.    Musculoskeletal: Normal range of motion.        General: No tenderness.  Skin:    General: Skin is warm and dry.  Neurological:     Mental Status: She is alert and oriented to person, place, and time.     Cranial Nerves: No cranial nerve deficit.     Deep Tendon Reflexes: Reflexes are normal and symmetric.  Psychiatric:        Behavior: Behavior normal.        Thought Content: Thought content normal.        Judgment: Judgment normal.       BP 97/67   Pulse 65   Temp (!) 97.3 F (36.3 C) (Oral)   Ht 5\' 4"  (1.626 m)   Wt 146 lb (66.2 kg)   BMI 25.06 kg/m      Assessment & Plan:  Michelle Delgado comes in today with chief complaint of Anxiety (three months recheck)   Diagnosis and orders addressed:  1. Mood disorder (HCC) Continue Trintellix Stress management discussed - vortioxetine HBr (TRINTELLIX) 20 MG TABS tablet; Take 1 tablet (20 mg total) by mouth daily.  Dispense: 90 tablet; Refill: 4  2. Panic attack - vortioxetine HBr (TRINTELLIX) 20 MG TABS tablet; Take 1 tablet (20 mg total) by mouth daily.  Dispense: 90 tablet; Refill: 4  3. Hypothyroidism, unspecified type - Thyroid Panel With TSH  4. Hair loss - Thyroid Panel With  TSH  5. Fatigue, unspecified type - Thyroid Panel With TSH   Labs pending Health Maintenance reviewed Diet and exercise encouraged  Follow up plan: 1 year    Jannifer Rodney, FNP

## 2018-02-07 NOTE — Patient Instructions (Signed)
Hypothyroidism  Hypothyroidism is when the thyroid gland does not make enough of certain hormones (it is underactive). The thyroid gland is a small gland located in the lower front part of the neck, just in front of the windpipe (trachea). This gland makes hormones that help control how the body uses food for energy (metabolism) as well as how the heart and brain function. These hormones also play a role in keeping your bones strong. When the thyroid is underactive, it produces too little of the hormones thyroxine (T4) and triiodothyronine (T3). What are the causes? This condition may be caused by:  Hashimoto's disease. This is a disease in which the body's disease-fighting system (immune system) attacks the thyroid gland. This is the most common cause.  Viral infections.  Pregnancy.  Certain medicines.  Birth defects.  Past radiation treatments to the head or neck for cancer.  Past treatment with radioactive iodine.  Past exposure to radiation in the environment.  Past surgical removal of part or all of the thyroid.  Problems with a gland in the center of the brain (pituitary gland).  Lack of enough iodine in the diet. What increases the risk? You are more likely to develop this condition if:  You are female.  You have a family history of thyroid conditions.  You use a medicine called lithium.  You take medicines that affect the immune system (immunosuppressants). What are the signs or symptoms? Symptoms of this condition include:  Feeling as though you have no energy (lethargy).  Not being able to tolerate cold.  Weight gain that is not explained by a change in diet or exercise habits.  Lack of appetite.  Dry skin.  Coarse hair.  Menstrual irregularity.  Slowing of thought processes.  Constipation.  Sadness or depression. How is this diagnosed? This condition may be diagnosed based on:  Your symptoms, your medical history, and a physical exam.  Blood  tests. You may also have imaging tests, such as an ultrasound or MRI. How is this treated? This condition is treated with medicine that replaces the thyroid hormones that your body does not make. After you begin treatment, it may take several weeks for symptoms to go away. Follow these instructions at home:  Take over-the-counter and prescription medicines only as told by your health care provider.  If you start taking any new medicines, tell your health care provider.  Keep all follow-up visits as told by your health care provider. This is important. ? As your condition improves, your dosage of thyroid hormone medicine may change. ? You will need to have blood tests regularly so that your health care provider can monitor your condition. Contact a health care provider if:  Your symptoms do not get better with treatment.  You are taking thyroid replacement medicine and you: ? Sweat a lot. ? Have tremors. ? Feel anxious. ? Lose weight rapidly. ? Cannot tolerate heat. ? Have emotional swings. ? Have diarrhea. ? Feel weak. Get help right away if you have:  Chest pain.  An irregular heartbeat.  A rapid heartbeat.  Difficulty breathing. Summary  Hypothyroidism is when the thyroid gland does not make enough of certain hormones (it is underactive).  When the thyroid is underactive, it produces too little of the hormones thyroxine (T4) and triiodothyronine (T3).  The most common cause is Hashimoto's disease, a disease in which the body's disease-fighting system (immune system) attacks the thyroid gland. The condition can also be caused by viral infections, medicine, pregnancy, or past   radiation treatment to the head or neck.  Symptoms may include weight gain, dry skin, constipation, feeling as though you do not have energy, and not being able to tolerate cold.  This condition is treated with medicine to replace the thyroid hormones that your body does not make. This information  is not intended to replace advice given to you by your health care provider. Make sure you discuss any questions you have with your health care provider. Document Released: 01/05/2005 Document Revised: 12/16/2016 Document Reviewed: 12/16/2016 Elsevier Interactive Patient Education  2019 Elsevier Inc.  

## 2018-02-08 LAB — THYROID PANEL WITH TSH
Free Thyroxine Index: 2.6 (ref 1.2–4.9)
T3 Uptake Ratio: 23 % — ABNORMAL LOW (ref 24–39)
T4, Total: 11.2 ug/dL (ref 4.5–12.0)
TSH: 1.02 u[IU]/mL (ref 0.450–4.500)

## 2018-12-01 ENCOUNTER — Other Ambulatory Visit: Payer: Self-pay

## 2018-12-01 DIAGNOSIS — F410 Panic disorder [episodic paroxysmal anxiety] without agoraphobia: Secondary | ICD-10-CM

## 2018-12-01 DIAGNOSIS — F411 Generalized anxiety disorder: Secondary | ICD-10-CM

## 2018-12-29 ENCOUNTER — Other Ambulatory Visit: Payer: Self-pay | Admitting: Family

## 2018-12-29 DIAGNOSIS — E039 Hypothyroidism, unspecified: Secondary | ICD-10-CM

## 2018-12-29 MED ORDER — SYNTHROID 125 MCG PO TABS: 125.0000 ug | ORAL_TABLET | Freq: Every day | ORAL | 0 refills | Status: DC

## 2019-02-02 ENCOUNTER — Ambulatory Visit: Payer: 59 | Admitting: Family

## 2019-03-15 ENCOUNTER — Other Ambulatory Visit: Payer: Self-pay

## 2019-03-16 ENCOUNTER — Encounter: Payer: Self-pay | Admitting: Family

## 2019-03-16 ENCOUNTER — Ambulatory Visit: Payer: 59 | Admitting: Family

## 2019-03-16 VITALS — BP 109/71 | HR 77 | Temp 98.0°F | Ht 64.0 in | Wt 149.8 lb

## 2019-03-16 DIAGNOSIS — Z3041 Encounter for surveillance of contraceptive pills: Secondary | ICD-10-CM | POA: Diagnosis not present

## 2019-03-16 DIAGNOSIS — Z0000 Encounter for general adult medical examination without abnormal findings: Secondary | ICD-10-CM

## 2019-03-16 DIAGNOSIS — F410 Panic disorder [episodic paroxysmal anxiety] without agoraphobia: Secondary | ICD-10-CM

## 2019-03-16 DIAGNOSIS — E039 Hypothyroidism, unspecified: Secondary | ICD-10-CM | POA: Diagnosis not present

## 2019-03-16 DIAGNOSIS — Z0001 Encounter for general adult medical examination with abnormal findings: Secondary | ICD-10-CM

## 2019-03-16 DIAGNOSIS — F39 Unspecified mood [affective] disorder: Secondary | ICD-10-CM | POA: Diagnosis not present

## 2019-03-16 MED ORDER — BUSPIRONE HCL 5 MG PO TABS: 5.0000 mg | ORAL_TABLET | Freq: Three times a day (TID) | ORAL | 3 refills | Status: DC

## 2019-03-16 MED ORDER — NORGESTIM-ETH ESTRAD TRIPHASIC 0.18/0.215/0.25 MG-35 MCG PO TABS: 1.0000 | ORAL_TABLET | Freq: Every day | ORAL | 4 refills | Status: DC

## 2019-03-16 MED ORDER — SYNTHROID 125 MCG PO TABS: 125.0000 ug | ORAL_TABLET | Freq: Every day | ORAL | 0 refills | Status: DC

## 2019-03-16 NOTE — Progress Notes (Signed)
Subjective:    Patient ID: Michelle Delgado, female    DOB: 1975-10-04, 44 y.o.   MRN: [id number]  Chief Complaint  Patient presents with  . Medical Management of Chronic Issues   Pt presents to the office today for CPE without pap. She is followed by GYN every 2 years. Her last pap was 11/02/17.   She reports she is more anxious over the last 6 months. She reports she has tried Trintellix, Lexapro, and Celexa but has caused her headaches and insomnia.   Pt currently OC and doing well at this time.  Thyroid Problem Presents for follow-up visit. Symptoms include anxiety, constipation, depressed mood, dry skin and fatigue. The symptoms have been worsening.  Anxiety Presents for follow-up visit. Symptoms include decreased concentration, depressed mood, excessive worry, irritability, nervous/anxious behavior and restlessness. Symptoms occur occasionally. The severity of symptoms is moderate. The quality of sleep is good.        Review of Systems  Constitutional: Positive for fatigue and irritability.  Gastrointestinal: Positive for constipation.  Psychiatric/Behavioral: Positive for decreased concentration. The patient is nervous/anxious.   All other systems reviewed and are negative.  Family History  Problem Relation Age of Onset  . Thyroid disease Mother   . Graves' disease Sister   . Aneurysm Maternal Grandmother    Social History   Socioeconomic History  . Marital status: Married    Spouse name: Not on file  . Number of children: Not on file  . Years of education: Not on file  . Highest education level: Not on file  Occupational History  . Not on file  Tobacco Use  . Smoking status: Never Smoker  . Smokeless tobacco: Never Used  Substance and Sexual Activity  . Alcohol use: Yes    Alcohol/week: 1.0 standard drinks    Types: 1 Glasses of wine per week  . Drug use: No  . Sexual activity: Yes    Birth control/protection: Pill  Other Topics Concern  . Not on file   Social History Narrative  . Not on file   Social Determinants of Health   Financial Resource Strain:   . Difficulty of Paying Living Expenses: Not on file  Food Insecurity:   . Worried About Charity fundraiser in the Last Year: Not on file  . Ran Out of Food in the Last Year: Not on file  Transportation Needs:   . Lack of Transportation (Medical): Not on file  . Lack of Transportation (Non-Medical): Not on file  Physical Activity:   . Days of Exercise per Week: Not on file  . Minutes of Exercise per Session: Not on file  Stress:   . Feeling of Stress : Not on file  Social Connections:   . Frequency of Communication with Friends and Family: Not on file  . Frequency of Social Gatherings with Friends and Family: Not on file  . Attends Religious Services: Not on file  . Active Member of Clubs or Organizations: Not on file  . Attends Archivist Meetings: Not on file  . Marital Status: Not on file        Objective:   Physical Exam Vitals reviewed.  Constitutional:      General: She is not in acute distress.    Appearance: She is well-developed.  HENT:     Head: Normocephalic and atraumatic.     Right Ear: Tympanic membrane normal.     Left Ear: Tympanic membrane normal.  Eyes:  Pupils: Pupils are equal, round, and reactive to light.  Neck:     Thyroid: No thyromegaly.  Cardiovascular:     Rate and Rhythm: Normal rate and regular rhythm.     Heart sounds: Normal heart sounds. No murmur.  Pulmonary:     Effort: Pulmonary effort is normal. No respiratory distress.     Breath sounds: Normal breath sounds. No wheezing.  Abdominal:     General: Bowel sounds are normal. There is no distension.     Palpations: Abdomen is soft.     Tenderness: There is no abdominal tenderness.  Musculoskeletal:        General: No tenderness. Normal range of motion.     Cervical back: Normal range of motion and neck supple.  Skin:    General: Skin is warm and dry.   Neurological:     Mental Status: She is alert and oriented to person, place, and time.     Cranial Nerves: No cranial nerve deficit.     Deep Tendon Reflexes: Reflexes are normal and symmetric.  Psychiatric:        Behavior: Behavior normal.        Thought Content: Thought content normal.        Judgment: Judgment normal.     BP 109/71   Pulse 77   Temp 98 F (36.7 C) (Temporal)   Ht '5\' 4"'  (1.626 m)   Wt 149 lb 12.8 oz (67.9 kg)   SpO2 98%   BMI 25.71 kg/m        Assessment & Plan:  LANNAH KOIKE comes in today with chief complaint of Annual Exam   Diagnosis and orders addressed:  1. Hypothyroidism, unspecified type - Thyroid Panel With TSH - CMP14+EGFR - SYNTHROID 125 MCG tablet; Take 1 tablet (125 mcg total) by mouth daily.  Dispense: 90 tablet; Refill: 0 - CBC with Differential/Platelet  2. Encounter for birth control pills maintenance - CMP14+EGFR - Norgestimate-Ethinyl Estradiol Triphasic (TRI-SPRINTEC) 0.18/0.215/0.25 MG-35 MCG tablet; Take 1 tablet by mouth daily.  Dispense: 3 Package; Refill: 4 - CBC with Differential/Platelet  3. Mood disorder (HCC) - CMP14+EGFR - CBC with Differential/Platelet - busPIRone (BUSPAR) 5 MG tablet; Take 1 tablet (5 mg total) by mouth 3 (three) times daily.  Dispense: 90 tablet; Refill: 3  4. Panic attack -Will start Buspar 5 mg TID today  Stress management discussed RTO in 4 weeks to recheck  - CMP14+EGFR - CBC with Differential/Platelet - busPIRone (BUSPAR) 5 MG tablet; Take 1 tablet (5 mg total) by mouth 3 (three) times daily.  Dispense: 90 tablet; Refill: 3  5. Annual physical exam - Thyroid Panel With TSH - Lipid panel - CMP14+EGFR - CBC with Differential/Platelet   Labs pending Health Maintenance reviewed Diet and exercise encouraged  Follow up plan: 4 weeks to recheck GAD   Evelina Dun, FNP

## 2019-03-16 NOTE — Patient Instructions (Signed)

## 2019-03-17 ENCOUNTER — Other Ambulatory Visit: Payer: Self-pay | Admitting: Family

## 2019-03-17 LAB — CBC WITH DIFFERENTIAL/PLATELET
Basophils Absolute: 0 10*3/uL (ref 0.0–0.2)
Basos: 1 %
EOS (ABSOLUTE): 0.1 10*3/uL (ref 0.0–0.4)
Eos: 2 %
Hematocrit: 44.1 % (ref 34.0–46.6)
Hemoglobin: 15.1 g/dL (ref 11.1–15.9)
Immature Grans (Abs): 0 10*3/uL (ref 0.0–0.1)
Immature Granulocytes: 0 %
Lymphocytes Absolute: 1.9 10*3/uL (ref 0.7–3.1)
Lymphs: 36 %
MCH: 32.2 pg (ref 26.6–33.0)
MCHC: 34.2 g/dL (ref 31.5–35.7)
MCV: 94 fL (ref 79–97)
Monocytes Absolute: 0.5 10*3/uL (ref 0.1–0.9)
Monocytes: 9 %
Neutrophils Absolute: 2.8 10*3/uL (ref 1.4–7.0)
Neutrophils: 52 %
Platelets: 272 10*3/uL (ref 150–450)
RBC: 4.69 x10E6/uL (ref 3.77–5.28)
RDW: 11.3 % — ABNORMAL LOW (ref 11.7–15.4)
WBC: 5.3 10*3/uL (ref 3.4–10.8)

## 2019-03-17 LAB — CMP14+EGFR
ALT: 70 IU/L — ABNORMAL HIGH (ref 0–32)
AST: 48 IU/L — ABNORMAL HIGH (ref 0–40)
Albumin/Globulin Ratio: 2.1 (ref 1.2–2.2)
Albumin: 4.8 g/dL (ref 3.8–4.8)
Alkaline Phosphatase: 71 IU/L (ref 39–117)
BUN/Creatinine Ratio: 14 (ref 9–23)
BUN: 12 mg/dL (ref 6–24)
Bilirubin Total: 0.5 mg/dL (ref 0.0–1.2)
CO2: 21 mmol/L (ref 20–29)
Calcium: 9.9 mg/dL (ref 8.7–10.2)
Chloride: 102 mmol/L (ref 96–106)
Creatinine, Ser: 0.86 mg/dL (ref 0.57–1.00)
GFR calc Af Amer: 96 mL/min/{1.73_m2} (ref >59)
GFR calc non Af Amer: 84 mL/min/{1.73_m2} (ref >59)
Globulin, Total: 2.3 g/dL (ref 1.5–4.5)
Glucose: 66 mg/dL (ref 65–99)
Potassium: 4.7 mmol/L (ref 3.5–5.2)
Sodium: 139 mmol/L (ref 134–144)
Total Protein: 7.1 g/dL (ref 6.0–8.5)

## 2019-03-17 LAB — THYROID PANEL WITH TSH
Free Thyroxine Index: 2.3 (ref 1.2–4.9)
T3 Uptake Ratio: 30 % (ref 24–39)
T4, Total: 7.7 ug/dL (ref 4.5–12.0)
TSH: 0.127 u[IU]/mL — ABNORMAL LOW (ref 0.450–4.500)

## 2019-03-17 LAB — LIPID PANEL
Chol/HDL Ratio: 3.7 ratio (ref 0.0–4.4)
Cholesterol, Total: 262 mg/dL — ABNORMAL HIGH (ref 100–199)
HDL: 70 mg/dL (ref >39)
LDL Chol Calc (NIH): 170 mg/dL — ABNORMAL HIGH (ref 0–99)
Triglycerides: 125 mg/dL (ref 0–149)
VLDL Cholesterol Cal: 22 mg/dL (ref 5–40)

## 2019-03-17 MED ORDER — LEVOTHYROXINE SODIUM 112 MCG PO TABS: 112.0000 ug | ORAL_TABLET | Freq: Every day | ORAL | 2 refills | Status: DC

## 2019-03-20 ENCOUNTER — Other Ambulatory Visit: Payer: Self-pay | Admitting: Family

## 2019-03-20 MED ORDER — LEVOTHYROXINE SODIUM 112 MCG PO TABS: 112.0000 ug | ORAL_TABLET | Freq: Every day | ORAL | 2 refills | Status: DC

## 2019-03-21 ENCOUNTER — Ambulatory Visit: Payer: 59 | Admitting: Family

## 2019-03-22 ENCOUNTER — Other Ambulatory Visit (HOSPITAL_COMMUNITY): Payer: Self-pay | Admitting: Family

## 2019-03-22 DIAGNOSIS — Z1231 Encounter for screening mammogram for malignant neoplasm of breast: Secondary | ICD-10-CM

## 2019-03-23 ENCOUNTER — Other Ambulatory Visit: Payer: Self-pay | Admitting: Family

## 2019-03-23 DIAGNOSIS — E039 Hypothyroidism, unspecified: Secondary | ICD-10-CM

## 2019-03-24 ENCOUNTER — Ambulatory Visit (HOSPITAL_COMMUNITY)
Admission: RE | Admit: 2019-03-24 | Discharge: 2019-03-24 | Disposition: A | Discharge status: Home / Self Care | Payer: 59 | Source: Ambulatory Visit | Attending: Family | Admitting: Family

## 2019-03-24 ENCOUNTER — Other Ambulatory Visit: Payer: Self-pay

## 2019-03-24 DIAGNOSIS — Z1231 Encounter for screening mammogram for malignant neoplasm of breast: Secondary | ICD-10-CM | POA: Insufficient documentation

## 2019-03-31 ENCOUNTER — Ambulatory Visit: Payer: 59 | Admitting: "Endocrinology

## 2019-06-13 ENCOUNTER — Ambulatory Visit: Payer: 59 | Admitting: Family

## 2019-07-20 ENCOUNTER — Ambulatory Visit: Payer: 59 | Admitting: Family

## 2019-08-29 ENCOUNTER — Ambulatory Visit: Payer: 59 | Admitting: Family Medicine

## 2019-09-04 ENCOUNTER — Encounter: Payer: Self-pay | Admitting: Family Medicine

## 2019-09-04 ENCOUNTER — Other Ambulatory Visit: Payer: Self-pay

## 2019-09-04 ENCOUNTER — Ambulatory Visit (INDEPENDENT_AMBULATORY_CARE_PROVIDER_SITE_OTHER): Payer: 59 | Admitting: Family Medicine

## 2019-09-04 VITALS — BP 110/76 | Ht 64.0 in | Wt 145.0 lb

## 2019-09-04 DIAGNOSIS — M79671 Pain in right foot: Secondary | ICD-10-CM | POA: Diagnosis not present

## 2019-09-04 DIAGNOSIS — M7631 Iliotibial band syndrome, right leg: Secondary | ICD-10-CM

## 2019-09-04 NOTE — Progress Notes (Signed)
PCP: Junie Spencer, FNP  Subjective:   HPI: Patient is a 44 y.o. female here for right foot pain.  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 She began having lateral right knee pain approximately 10 years ago that she attributed to her IT band. She then began to experience right lateral hip pain that she described as a band wrapping around to posterior hip with a sharp pain. Both knee pain and hip pain would be exacerbated by long-distance running. She tried PT, home stretches, icing with some minimal relief but relief was not sustained for long periods of time.   In the past 1 year, she has noticed that now her right foot is beginning to bother her. Mrs. Hoge describes right lateral plantar surface foot pain that radiates occasionally to the dorsal aspect. The most recent episode of pain was yesterday and rated as moderate. Pain often self-alleviates without intervention. No exacerbating factors for yesterday's episode. Mrs. Kassner denies any numbness/tingling, recent fall or trauma, swelling. She is concerned about the pain as she is hoping to begin long-distance running again. Has not been running recently.  Past Medical History:  Diagnosis Date   Allergy    Anxiety    Chronic headaches    Depression    Hypothyroidism     Current Outpatient Medications on File Prior to Visit  Medication Sig Dispense Refill   levothyroxine (SYNTHROID) 112 MCG tablet Take 1 tablet (112 mcg total) by mouth daily. 90 tablet 2   busPIRone (BUSPAR) 5 MG tablet Take 1 tablet (5 mg total) by mouth 3 (three) times daily. (Patient not taking: Reported on 09/04/2019) 90 tablet 3   Norgestimate-Ethinyl Estradiol Triphasic (TRI-SPRINTEC) 0.18/0.215/0.25 MG-35 MCG tablet Take 1 tablet by mouth daily. 3 Package 4   No current facility-administered medications on file prior to visit.    Past Surgical History:  Procedure Laterality Date   CESAREAN SECTION  2005    Allergies   Allergen Reactions   Sulfa Antibiotics Rash    Social History   Socioeconomic History   Marital status: Married    Spouse name: Not on file   Number of children: Not on file   Years of education: Not on file   Highest education level: Not on file  Occupational History   Not on file  Tobacco Use   Smoking status: Never Smoker   Smokeless tobacco: Never Used  Substance and Sexual Activity   Alcohol use: Yes    Alcohol/week: 1.0 standard drink    Types: 1 Glasses of wine per week   Drug use: No   Sexual activity: Yes    Birth control/protection: Pill  Other Topics Concern   Not on file  Social History Narrative   Not on file   Social Determinants of Health   Financial Resource Strain:    Difficulty of Paying Living Expenses:   Food Insecurity:    Worried About Programme researcher, broadcasting/film/video in the Last Year:    Barista in the Last Year:   Transportation Needs:    Freight forwarder (Medical):    Lack of Transportation (Non-Medical):   Physical Activity:    Days of Exercise per Week:    Minutes of Exercise per Session:   Stress:    Feeling of Stress :   Social Connections:    Frequency of Communication with Friends and Family:    Frequency of Social Gatherings with Friends and Family:    Attends  Religious Services:    Active Member of Clubs or Organizations:    Attends Banker Meetings:    Marital Status:   Intimate Partner Violence:    Fear of Current or Ex-Partner:    Emotionally Abused:    Physically Abused:    Sexually Abused:     Family History  Problem Relation Age of Onset   Thyroid disease Mother    Graves' disease Sister    Aneurysm Maternal Grandmother     BP 110/76    Ht 5\' 4"  (1.626 m)    Wt 145 lb (65.8 kg)    BMI 24.89 kg/m   Review of Systems: See HPI above.     Objective:  Physical Exam:  Gen: NAD, comfortable in exam room  Right hip: No deformity. FROM with 5/5 strength  including hip abduction. No tenderness to palpation but spasm within glut medius. NVI distally. Negative faber, fadir.  Positive ober.  Negative noble.  Right knee: No gross deformity, ecchymoses, swelling. No TTP. FROM. Negative ant/post drawers. Negative valgus/varus testing. Negative lachmans. NV intact distally.  Right foot/ankle: No gross deformity, swelling, ecchymoses.  Pronation right more than left. FROM No TTP Negative ant drawer and talar tilt.   Thompsons test negative. NV intact distally. Leg lengths equal.  Gait:  Midfoot striker landing with mild supination, outturning right foot.   Assessment & Plan:  1. Right foot pain: Exam reassuring.  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  Sports insoles, line drills.  F/u 5-6 weeks.  Tylenol, aleve only if needed.  2. Right hip and knee pain: Secondary to IT band tightness. Recommended daily exercises and stretches which were reviewed today.  F/u in 5-6 weeks.

## 2019-09-04 NOTE — Patient Instructions (Signed)
Ice over area of pain 3-4 times a day for 15 minutes at a time if needed. Hip side raise exercise 3 sets of 10 once a day - add weights if this becomes too easy. Stretches - pick 2-3 and hold for 20-30 seconds x 3 - do once or twice a day. Tylenol and/or aleve as needed for pain. Start line drills as we discussed every day focusing on great toe landing in same spot on the line. Sports insoles can be switched between different shoes - wear these regularly. Try to avoid barefoot walking, flat shoes when possible. Follow up with me in 5-6 weeks.

## 2019-09-12 ENCOUNTER — Ambulatory Visit (INDEPENDENT_AMBULATORY_CARE_PROVIDER_SITE_OTHER): Payer: 59 | Admitting: Family

## 2019-09-12 ENCOUNTER — Encounter: Payer: Self-pay | Admitting: Family

## 2019-09-12 DIAGNOSIS — F320 Major depressive disorder, single episode, mild: Secondary | ICD-10-CM | POA: Diagnosis not present

## 2019-09-12 DIAGNOSIS — F410 Panic disorder [episodic paroxysmal anxiety] without agoraphobia: Secondary | ICD-10-CM

## 2019-09-12 DIAGNOSIS — F411 Generalized anxiety disorder: Secondary | ICD-10-CM | POA: Diagnosis not present

## 2019-09-12 MED ORDER — VORTIOXETINE HBR 10 MG PO TABS: 10.0000 mg | ORAL_TABLET | Freq: Every day | ORAL | 1 refills | Status: DC

## 2019-09-12 MED ORDER — VORTIOXETINE HBR 5 MG PO TABS: ORAL_TABLET | ORAL | 0 refills | Status: AC

## 2019-09-12 MED ORDER — ALPRAZOLAM 0.25 MG PO TABS: 0.2500 mg | ORAL_TABLET | Freq: Every evening | ORAL | 1 refills | Status: DC | PRN

## 2019-09-12 NOTE — Progress Notes (Signed)
Virtual Visit via telephone Note Due to COVID-19 pandemic this visit was conducted virtually. This visit type was conducted due to national recommendations for restrictions regarding the COVID-19 Pandemic (e.g. social distancing, sheltering in place) in an effort to limit this patient's exposure and mitigate transmission in our community. All issues noted in this document were discussed and addressed.  A physical exam was not performed with this format.  I connected with Michelle Delgado on 09/12/19 at 10:37 AM by telephone and verified that I am speaking with the correct person using two identifiers. Michelle Delgado is currently located at home and no one is currently with her during visit. The provider, Jannifer Rodney, FNP is located in their office at time of visit.  I discussed the limitations, risks, security and privacy concerns of performing an evaluation and management service by telephone and the availability of in person appointments. I also discussed with the patient that there may be a patient responsible charge related to this service. The patient expressed understanding and agreed to proceed.   History and Present Illness: PT calls the office today with increased anxiety. She states her daughter was recently in the hospital and believes this has triggered it. She states she has tried Buspar in the past but makes her dizzy. She Lexapro, Celexa, Prozac, Zoloft but caused her insomnia and headaches. She has tried trintellix and has worked well for her.  Anxiety Presents for follow-up visit. Symptoms include depressed mood, excessive worry, irritability, nervous/anxious behavior and panic. Symptoms occur occasionally. The severity of symptoms is moderate. The quality of sleep is good.        Review of Systems  Constitutional: Positive for irritability.  Psychiatric/Behavioral: The patient is nervous/anxious.      Observations/Objective: No SOB or distress noted   Assessment  and Plan: Michelle Delgado comes in today with chief complaint of No chief complaint on file.   Diagnosis and orders addressed:  1. GAD (generalized anxiety disorder) - vortioxetine HBr (TRINTELLIX) 5 MG TABS tablet; Take 1 tablet (5 mg total) by mouth daily for 21 days, THEN 2 tablets (10 mg total) daily for 21 days.  Dispense: 63 tablet; Refill: 0 - vortioxetine HBr (TRINTELLIX) 10 MG TABS tablet; Take 1 tablet (10 mg total) by mouth daily.  Dispense: 30 tablet; Refill: 1 - ALPRAZolam (XANAX) 0.25 MG tablet; Take 1 tablet (0.25 mg total) by mouth at bedtime as needed for anxiety.  Dispense: 10 tablet; Refill: 1  2. Panic attack - vortioxetine HBr (TRINTELLIX) 5 MG TABS tablet; Take 1 tablet (5 mg total) by mouth daily for 21 days, THEN 2 tablets (10 mg total) daily for 21 days.  Dispense: 63 tablet; Refill: 0 - vortioxetine HBr (TRINTELLIX) 10 MG TABS tablet; Take 1 tablet (10 mg total) by mouth daily.  Dispense: 30 tablet; Refill: 1 - ALPRAZolam (XANAX) 0.25 MG tablet; Take 1 tablet (0.25 mg total) by mouth at bedtime as needed for anxiety.  Dispense: 10 tablet; Refill: 1  3. Depression, major, single episode, mild (HCC) - vortioxetine HBr (TRINTELLIX) 5 MG TABS tablet; Take 1 tablet (5 mg total) by mouth daily for 21 days, THEN 2 tablets (10 mg total) daily for 21 days.  Dispense: 63 tablet; Refill: 0 - vortioxetine HBr (TRINTELLIX) 10 MG TABS tablet; Take 1 tablet (10 mg total) by mouth daily.  Dispense: 30 tablet; Refill: 1   Will start Trintellix 5 mg for 21 days then increase to 10 mg  Will given Xanax  0.25 mg #10. She states she only needs this as needed if her anxiety worsens or has a true panic attack. Discussed I will not refill.  Stress management discussed    I discussed the assessment and treatment plan with the patient. The patient was provided an opportunity to ask questions and all were answered. The patient agreed with the plan and demonstrated an understanding of the  instructions.   The patient was advised to call back or seek an in-person evaluation if the symptoms worsen or if the condition fails to improve as anticipated.  The above assessment and management plan was discussed with the patient. The patient verbalized understanding of and has agreed to the management plan. Patient is aware to call the clinic if symptoms persist or worsen. Patient is aware when to return to the clinic for a follow-up visit. Patient educated on when it is appropriate to go to the emergency department.   Time call ended:  10:53 AM   I provided 16 minutes of non-face-to-face time during this encounter.    Jannifer Rodney, FNP

## 2019-09-18 ENCOUNTER — Telehealth: Payer: Self-pay

## 2019-09-18 NOTE — Telephone Encounter (Signed)
PA received- Trintellix 5 mg  Alternatives that do not require a PA- Escitalopram oxalate  Citalopram Hydrobromide  Venlafaxine HCI ER Paroxetine HCL Sertalinne HCL Fluoxetine HCL Duloxetine HCL  KEY: [id number]  Please advise

## 2019-09-18 NOTE — Telephone Encounter (Signed)
She states she has tried Buspar in the past but makes her dizzy. She Lexapro, Celexa, Prozac, Zoloft but caused her insomnia and headaches. She has tried trintellix and has worked well for her.

## 2019-09-19 NOTE — Telephone Encounter (Signed)
Approved today Request Reference Number: [id number]. TRINTELLIX TAB 5MG  is approved through 09/18/2020. Your patient may now fill this prescription and it will be covered  WM called and aware

## 2019-09-19 NOTE — Telephone Encounter (Signed)
PA started for pt  Key: [id number]  Sent to plan

## 2019-10-09 ENCOUNTER — Ambulatory Visit: Payer: 59 | Admitting: Family Medicine

## 2019-11-14 ENCOUNTER — Encounter: Payer: Self-pay | Admitting: Family

## 2019-11-14 ENCOUNTER — Ambulatory Visit (INDEPENDENT_AMBULATORY_CARE_PROVIDER_SITE_OTHER): Payer: 59 | Admitting: Family

## 2019-11-14 DIAGNOSIS — F410 Panic disorder [episodic paroxysmal anxiety] without agoraphobia: Secondary | ICD-10-CM

## 2019-11-14 DIAGNOSIS — F411 Generalized anxiety disorder: Secondary | ICD-10-CM | POA: Diagnosis not present

## 2019-11-14 DIAGNOSIS — F320 Major depressive disorder, single episode, mild: Secondary | ICD-10-CM

## 2019-11-14 DIAGNOSIS — F39 Unspecified mood [affective] disorder: Secondary | ICD-10-CM | POA: Diagnosis not present

## 2019-11-14 MED ORDER — BUPROPION HCL ER (XL) 150 MG PO TB24: 150.0000 mg | ORAL_TABLET | Freq: Every day | ORAL | 3 refills | Status: DC

## 2019-11-14 MED ORDER — VORTIOXETINE HBR 10 MG PO TABS: 10.0000 mg | ORAL_TABLET | Freq: Every day | ORAL | 3 refills | Status: DC

## 2019-11-14 NOTE — Progress Notes (Signed)
Virtual Visit via telephone Note Due to COVID-19 pandemic this visit was conducted virtually. This visit type was conducted due to national recommendations for restrictions regarding the COVID-19 Pandemic (e.g. social distancing, sheltering in place) in an effort to limit this patient's exposure and mitigate transmission in our community. All issues noted in this document were discussed and addressed.  A physical exam was not performed with this format.  I connected with Michelle Delgado on 11/14/19 at 8:26 AM  by telephone and verified that I am speaking with the correct person using two identifiers. Michelle Delgado is currently located at car and no one is currently with her during visit. The provider, Jannifer Rodney, FNP is located in their office at time of visit.  I discussed the limitations, risks, security and privacy concerns of performing an evaluation and management service by telephone and the availability of in person appointments. I also discussed with the patient that there may be a patient responsible charge related to this service. The patient expressed understanding and agreed to proceed.   History and Present Illness:  Depression        This is a chronic problem.  The current episode started more than 1 year ago.   The onset quality is gradual.   The problem occurs intermittently.  Associated symptoms include decreased concentration, insomnia, irritable, restlessness, decreased interest and sad.  Associated symptoms include no helplessness, no hopelessness and no suicidal ideas.  Past treatments include SNRIs - Serotonin and norepinephrine reuptake inhibitors.  Compliance with treatment is good.  Past medical history includes anxiety.   Anxiety Presents for follow-up visit. Symptoms include decreased concentration, excessive worry, insomnia, irritability, nervous/anxious behavior and restlessness. Patient reports no suicidal ideas. Symptoms occur most days. The severity of symptoms  is moderate.        Review of Systems  Constitutional: Positive for irritability.  Psychiatric/Behavioral: Positive for decreased concentration and depression. Negative for suicidal ideas. The patient is nervous/anxious and has insomnia.   All other systems reviewed and are negative.    Observations/Objective: No SOB or distress noted, not flat affect  Assessment and Plan: 1. Panic attack - buPROPion (WELLBUTRIN XL) 150 MG 24 hr tablet; Take 1 tablet (150 mg total) by mouth daily.  Dispense: 90 tablet; Refill: 3 - vortioxetine HBr (TRINTELLIX) 10 MG TABS tablet; Take 1 tablet (10 mg total) by mouth daily.  Dispense: 90 tablet; Refill: 3  2. Mood disorder (HCC) - buPROPion (WELLBUTRIN XL) 150 MG 24 hr tablet; Take 1 tablet (150 mg total) by mouth daily.  Dispense: 90 tablet; Refill: 3 - vortioxetine HBr (TRINTELLIX) 10 MG TABS tablet; Take 1 tablet (10 mg total) by mouth daily.  Dispense: 90 tablet; Refill: 3  3. Depression, major, single episode, mild (HCC) - buPROPion (WELLBUTRIN XL) 150 MG 24 hr tablet; Take 1 tablet (150 mg total) by mouth daily.  Dispense: 90 tablet; Refill: 3 - vortioxetine HBr (TRINTELLIX) 10 MG TABS tablet; Take 1 tablet (10 mg total) by mouth daily.  Dispense: 90 tablet; Refill: 3  4. GAD (generalized anxiety disorder) - buPROPion (WELLBUTRIN XL) 150 MG 24 hr tablet; Take 1 tablet (150 mg total) by mouth daily.  Dispense: 90 tablet; Refill: 3 - vortioxetine HBr (TRINTELLIX) 10 MG TABS tablet; Take 1 tablet (10 mg total) by mouth daily.  Dispense: 90 tablet; Refill: 3  We will continue Trintellix. She is unable to increase current dose of 10 mg daily because of nausea.  We will add Wellbutrin  today Stress management discussed RTO in 6 weeks if we want to increase Wellbutrin     I discussed the assessment and treatment plan with the patient. The patient was provided an opportunity to ask questions and all were answered. The patient agreed with the plan  and demonstrated an understanding of the instructions.   The patient was advised to call back or seek an in-person evaluation if the symptoms worsen or if the condition fails to improve as anticipated.  The above assessment and management plan was discussed with the patient. The patient verbalized understanding of and has agreed to the management plan. Patient is aware to call the clinic if symptoms persist or worsen. Patient is aware when to return to the clinic for a follow-up visit. Patient educated on when it is appropriate to go to the emergency department.   Time call ended:  8: 47 AM  I provided 21 minutes of non-face-to-face time during this encounter.    Jannifer Rodney, FNP

## 2019-11-15 ENCOUNTER — Emergency Department (HOSPITAL_COMMUNITY): Payer: 59

## 2019-11-15 ENCOUNTER — Encounter (HOSPITAL_COMMUNITY): Payer: Self-pay

## 2019-11-15 ENCOUNTER — Other Ambulatory Visit: Payer: Self-pay

## 2019-11-15 ENCOUNTER — Observation Stay (HOSPITAL_COMMUNITY)
Admission: EM | Admit: 2019-11-15 | Discharge: 2019-11-16 | Disposition: A | Discharge status: Home / Self Care | Payer: 59 | Attending: General Surgery | Admitting: General Surgery

## 2019-11-15 DIAGNOSIS — E039 Hypothyroidism, unspecified: Secondary | ICD-10-CM | POA: Insufficient documentation

## 2019-11-15 DIAGNOSIS — Z79899 Other long term (current) drug therapy: Secondary | ICD-10-CM | POA: Diagnosis not present

## 2019-11-15 DIAGNOSIS — Z20822 Contact with and (suspected) exposure to covid-19: Secondary | ICD-10-CM | POA: Insufficient documentation

## 2019-11-15 DIAGNOSIS — R1011 Right upper quadrant pain: Secondary | ICD-10-CM | POA: Diagnosis present

## 2019-11-15 DIAGNOSIS — K353 Acute appendicitis with localized peritonitis, without perforation or gangrene: Secondary | ICD-10-CM | POA: Diagnosis not present

## 2019-11-15 DIAGNOSIS — K3580 Unspecified acute appendicitis: Secondary | ICD-10-CM | POA: Diagnosis present

## 2019-11-15 LAB — URINALYSIS, ROUTINE W REFLEX MICROSCOPIC
Bilirubin Urine: NEGATIVE
Glucose, UA: NEGATIVE mg/dL
Hgb urine dipstick: NEGATIVE
Ketones, ur: 20 mg/dL — AB
Leukocytes,Ua: NEGATIVE
Nitrite: NEGATIVE
Protein, ur: NEGATIVE mg/dL
Specific Gravity, Urine: 1.024 (ref 1.005–1.030)
pH: 7 (ref 5.0–8.0)

## 2019-11-15 LAB — CBC
HCT: 42.6 % (ref 36.0–46.0)
Hemoglobin: 14.3 g/dL (ref 12.0–15.0)
MCH: 31.6 pg (ref 26.0–34.0)
MCHC: 33.6 g/dL (ref 30.0–36.0)
MCV: 94.2 fL (ref 80.0–100.0)
Platelets: 280 10*3/uL (ref 150–400)
RBC: 4.52 MIL/uL (ref 3.87–5.11)
RDW: 11.8 % (ref 11.5–15.5)
WBC: 15.7 10*3/uL — ABNORMAL HIGH (ref 4.0–10.5)
nRBC: 0 % (ref 0.0–0.2)

## 2019-11-15 LAB — COMPREHENSIVE METABOLIC PANEL
ALT: 12 U/L (ref 0–44)
AST: 15 U/L (ref 15–41)
Albumin: 4.4 g/dL (ref 3.5–5.0)
Alkaline Phosphatase: 60 U/L (ref 38–126)
Anion gap: 11 (ref 5–15)
BUN: 14 mg/dL (ref 6–20)
CO2: 24 mmol/L (ref 22–32)
Calcium: 9.7 mg/dL (ref 8.9–10.3)
Chloride: 99 mmol/L (ref 98–111)
Creatinine, Ser: 0.62 mg/dL (ref 0.44–1.00)
GFR, Estimated: >60 mL/min (ref >60)
Glucose, Bld: 105 mg/dL — ABNORMAL HIGH (ref 70–99)
Potassium: 4.2 mmol/L (ref 3.5–5.1)
Sodium: 134 mmol/L — ABNORMAL LOW (ref 135–145)
Total Bilirubin: 1.1 mg/dL (ref 0.3–1.2)
Total Protein: 8 g/dL (ref 6.5–8.1)

## 2019-11-15 LAB — RESPIRATORY PANEL BY RT PCR (FLU A&B, COVID)
Influenza A by PCR: NEGATIVE
Influenza B by PCR: NEGATIVE
SARS Coronavirus 2 by RT PCR: NEGATIVE

## 2019-11-15 LAB — POC URINE PREG, ED: Preg Test, Ur: NEGATIVE

## 2019-11-15 LAB — LIPASE, BLOOD: Lipase: 22 U/L (ref 11–51)

## 2019-11-15 MED ORDER — MORPHINE SULFATE (PF) 2 MG/ML IV SOLN
2.0000 mg | INTRAVENOUS | Status: DC | PRN
  Administered 2019-11-16: 2 mg via INTRAVENOUS
  Filled 2019-11-15: qty 1

## 2019-11-15 MED ORDER — IOHEXOL 300 MG/ML  SOLN
100.0000 mL | Freq: Once | INTRAMUSCULAR | Status: AC | PRN
  Administered 2019-11-15: 100 mL via INTRAVENOUS

## 2019-11-15 MED ORDER — DIPHENHYDRAMINE HCL 12.5 MG/5ML PO ELIX: 12.5000 mg | ORAL_SOLUTION | Freq: Four times a day (QID) | ORAL | Status: DC | PRN

## 2019-11-15 MED ORDER — SODIUM CHLORIDE 0.9 % IV SOLN
2.0000 g | INTRAVENOUS | Status: AC
  Administered 2019-11-16: 2 g via INTRAVENOUS
  Filled 2019-11-15: qty 2

## 2019-11-15 MED ORDER — SODIUM CHLORIDE 0.9 % IV BOLUS
1000.0000 mL | Freq: Once | INTRAVENOUS | Status: AC
  Administered 2019-11-15: 1000 mL via INTRAVENOUS

## 2019-11-15 MED ORDER — SODIUM CHLORIDE 0.9 % IV SOLN
2.0000 g | Freq: Once | INTRAVENOUS | Status: AC
  Administered 2019-11-15: 2 g via INTRAVENOUS
  Filled 2019-11-15: qty 20

## 2019-11-15 MED ORDER — ENOXAPARIN SODIUM 40 MG/0.4ML ~~LOC~~ SOLN
40.0000 mg | Freq: Every day | SUBCUTANEOUS | Status: DC
  Administered 2019-11-15: 40 mg via SUBCUTANEOUS
  Filled 2019-11-15: qty 0.4

## 2019-11-15 MED ORDER — ONDANSETRON 4 MG PO TBDP: 4.0000 mg | ORAL_TABLET | Freq: Four times a day (QID) | ORAL | Status: DC | PRN

## 2019-11-15 MED ORDER — DIPHENHYDRAMINE HCL 50 MG/ML IJ SOLN: 12.5000 mg | Freq: Four times a day (QID) | INTRAMUSCULAR | Status: DC | PRN

## 2019-11-15 MED ORDER — LACTATED RINGERS IV SOLN: INTRAVENOUS | Status: DC

## 2019-11-15 MED ORDER — MORPHINE SULFATE (PF) 4 MG/ML IV SOLN
4.0000 mg | Freq: Once | INTRAVENOUS | Status: AC
  Administered 2019-11-15: 4 mg via INTRAVENOUS
  Filled 2019-11-15: qty 1

## 2019-11-15 MED ORDER — ACETAMINOPHEN 650 MG RE SUPP: 650.0000 mg | Freq: Four times a day (QID) | RECTAL | Status: DC | PRN

## 2019-11-15 MED ORDER — METRONIDAZOLE IN NACL 5-0.79 MG/ML-% IV SOLN
500.0000 mg | Freq: Once | INTRAVENOUS | Status: AC
  Administered 2019-11-15: 500 mg via INTRAVENOUS
  Filled 2019-11-15: qty 100

## 2019-11-15 MED ORDER — METOPROLOL TARTRATE 5 MG/5ML IV SOLN: 5.0000 mg | Freq: Four times a day (QID) | INTRAVENOUS | Status: DC | PRN

## 2019-11-15 MED ORDER — ZOLPIDEM TARTRATE 5 MG PO TABS: 5.0000 mg | ORAL_TABLET | Freq: Every evening | ORAL | Status: DC | PRN

## 2019-11-15 MED ORDER — SIMETHICONE 80 MG PO CHEW: 40.0000 mg | CHEWABLE_TABLET | Freq: Four times a day (QID) | ORAL | Status: DC | PRN

## 2019-11-15 MED ORDER — ONDANSETRON HCL 4 MG/2ML IJ SOLN: 4.0000 mg | Freq: Four times a day (QID) | INTRAMUSCULAR | Status: DC | PRN

## 2019-11-15 MED ORDER — ONDANSETRON HCL 4 MG/2ML IJ SOLN
4.0000 mg | Freq: Once | INTRAMUSCULAR | Status: AC
  Administered 2019-11-15: 4 mg via INTRAVENOUS
  Filled 2019-11-15: qty 2

## 2019-11-15 MED ORDER — CHLORHEXIDINE GLUCONATE CLOTH 2 % EX PADS: 6.0000 | MEDICATED_PAD | Freq: Once | CUTANEOUS | Status: DC

## 2019-11-15 MED ORDER — OXYCODONE HCL 5 MG PO TABS: 5.0000 mg | ORAL_TABLET | ORAL | Status: DC | PRN

## 2019-11-15 MED ORDER — ACETAMINOPHEN 325 MG PO TABS: 650.0000 mg | ORAL_TABLET | Freq: Four times a day (QID) | ORAL | Status: DC | PRN

## 2019-11-15 NOTE — Progress Notes (Signed)
[facility]  CT with acute appendicitis, uncomplicated. Iv antibiotics now. NPO midnight.   OR tomorrow once available. Will discuss surgery with patient in Am. COVID pending but if comes back positive will plan for antibiotics only and no surgery  Algis Greenhouse, MD [facility] [address] [place], [place] [address] [phone] (office)

## 2019-11-15 NOTE — ED Triage Notes (Signed)
Pt to er, pt states that she had some sharp abd pain that started this am, reports a gradual onset, states that it started in her upper abd and is now moving to her RLQ, states that she took some tums and this seems to have made the pain worse.

## 2019-11-15 NOTE — ED Provider Notes (Signed)
[facility] EMERGENCY DEPARTMENT Provider Note   CSN: [id number] Arrival date & time: 11/15/19  1641     History Chief Complaint  Patient presents with  . Abdominal Pain    Michelle Delgado is a 44 y.o. female.  Pt presents to the ED today with abdominal pain.  Pain started in her upper abdomen, but has now moved lower.  Pt took several meds for heart burn, but that did not help.  Pt has not had f/c.  No n/v.        Past Medical History:  Diagnosis Date  . Allergy   . Anxiety   . Chronic headaches   . Depression   . Hypothyroidism     Patient Active Problem List   Diagnosis Date Noted  . Depression, major, single episode, mild (HCC) 11/14/2019  . Panic attack 08/19/2017  . Mood disorder (HCC) 09/03/2015  . Hypothyroidism 08/16/2015  . History of anemia 08/16/2015    Past Surgical History:  Procedure Laterality Date  . CESAREAN SECTION  2005     OB History   No obstetric history on file.     Family History  Problem Relation Age of Onset  . Thyroid disease Mother   . Graves' disease Sister   . Aneurysm Maternal Grandmother     Social History   Tobacco Use  . Smoking status: Never Smoker  . Smokeless tobacco: Never Used  Substance Use Topics  . Alcohol use: Yes    Alcohol/week: 1.0 standard drink    Types: 1 Glasses of wine per week  . Drug use: No    Home Medications Prior to Admission medications   Medication Sig Start Date End Date Taking? Authorizing Provider  ALPRAZolam (XANAX) 0.25 MG tablet Take 1 tablet (0.25 mg total) by mouth at bedtime as needed for anxiety. 09/12/19  Yes Hawks, Christy A, FNP  ibuprofen (ADVIL) 200 MG tablet Take 400 mg by mouth every 6 (six) hours as needed.   Yes [provider]  levothyroxine (SYNTHROID) 112 MCG tablet Take 1 tablet (112 mcg total) by mouth daily. 03/20/19 03/19/20 Yes Hawks, Christy A, FNP  vortioxetine HBr (TRINTELLIX) 10 MG TABS tablet Take 1 tablet (10 mg total) by mouth daily. 11/14/19   Yes Hawks, Christy A, FNP  buPROPion (WELLBUTRIN XL) 150 MG 24 hr tablet Take 1 tablet (150 mg total) by mouth daily. 11/14/19   Junie Spencer, FNP  Norgestimate-Ethinyl Estradiol Triphasic (TRI-SPRINTEC) 0.18/0.215/0.25 MG-35 MCG tablet Take 1 tablet by mouth daily. Patient not taking: Reported on 11/15/2019 03/16/19   Junie Spencer, FNP    Allergies    Sulfa antibiotics  Review of Systems   Review of Systems  Gastrointestinal: Positive for abdominal pain.  All other systems reviewed and are negative.   Physical Exam Updated Vital Signs BP 118/76 (BP Location: Right Arm)   Pulse 75   Temp 98.3 F (36.8 C) (Oral)   Resp 17   Ht 5\' 4"  (1.626 m)   Wt 65.8 kg   SpO2 100%   BMI 24.89 kg/m   Physical Exam Vitals and nursing note reviewed.  Constitutional:      Appearance: She is well-developed.  HENT:     Head: Normocephalic and atraumatic.     Mouth/Throat:     Mouth: Mucous membranes are moist.     Pharynx: Oropharynx is clear.  Eyes:     Extraocular Movements: Extraocular movements intact.     Pupils: Pupils are equal, round, and reactive  to light.  Cardiovascular:     Rate and Rhythm: Normal rate and regular rhythm.  Abdominal:     General: Abdomen is flat. Bowel sounds are normal.     Palpations: Abdomen is soft.     Tenderness: There is abdominal tenderness in the right upper quadrant and right lower quadrant.  Skin:    General: Skin is warm.     Capillary Refill: Capillary refill takes less than 2 seconds.  Neurological:     General: No focal deficit present.     Mental Status: She is alert and oriented to person, place, and time.  Psychiatric:        Mood and Affect: Mood normal.        Behavior: Behavior normal.     ED Results / Procedures / Treatments   Labs (all labs ordered are listed, but only abnormal results are displayed) Labs Reviewed  COMPREHENSIVE METABOLIC PANEL - Abnormal; Notable for the following components:      Result Value    Sodium 134 (*)    Glucose, Bld 105 (*)    All other components within normal limits  CBC - Abnormal; Notable for the following components:   WBC 15.7 (*)    All other components within normal limits  URINALYSIS, ROUTINE W REFLEX MICROSCOPIC - Abnormal; Notable for the following components:   Ketones, ur 20 (*)    All other components within normal limits  RESPIRATORY PANEL BY RT PCR (FLU A&B, COVID)  LIPASE, BLOOD  POC URINE PREG, ED    EKG None  Radiology CT ABDOMEN PELVIS W CONTRAST  Result Date: 11/15/2019 CLINICAL DATA:  44 year old female with right lower quadrant abdominal pain. EXAM: CT ABDOMEN AND PELVIS WITH CONTRAST TECHNIQUE: Multidetector CT imaging of the abdomen and pelvis was performed using the standard protocol following bolus administration of intravenous contrast. CONTRAST:  OMNIPAQUE IOHEXOL 300 MG/ML  SOLN COMPARISON:  None. FINDINGS: Lower chest: The visualized lung bases are clear. No intra-abdominal free air or free fluid. Hepatobiliary: No focal liver abnormality is seen. No gallstones, gallbladder wall thickening, or biliary dilatation. Pancreas: Unremarkable. No pancreatic ductal dilatation or surrounding inflammatory changes. Spleen: Normal in size without focal abnormality. Adrenals/Urinary Tract: The adrenal glands unremarkable. There is no hydronephrosis on either side. There is symmetric enhancement and excretion of contrast by both kidneys. The visualized ureters and urinary bladder appear unremarkable. Stomach/Bowel: There is no bowel obstruction. The appendix is enlarged and inflamed measuring 12 mm in diameter. There is enhancement of the appendiceal mucosa consistent with hyperemia. The appendix is located in the right lower quadrant medial to the cecum and anterior to the right psoas muscle. No evidence of perforation or abscess. Vascular/Lymphatic: The abdominal aorta and IVC are unremarkable. No portal venous gas. There is no adenopathy.  Reproductive: The uterus is anteverted and grossly unremarkable. There is a 15 mm left ovarian dominant follicle or corpus luteum. The right ovary is unremarkable. Other: None Musculoskeletal: No acute or significant osseous findings. IMPRESSION: Acute appendicitis. No abscess or perforation. Electronically Signed   By: Elgie Collard M.D.   On: 11/15/2019 22:03    Procedures Procedures (including critical care time)  Medications Ordered in ED Medications  cefTRIAXone (ROCEPHIN) 2 g in sodium chloride 0.9 % 100 mL IVPB (2 g Intravenous New Bag/Given 11/15/19 2221)    And  metroNIDAZOLE (FLAGYL) IVPB 500 mg (500 mg Intravenous New Bag/Given 11/15/19 2221)  morphine 4 MG/ML injection 4 mg (has no administration in time range)  sodium chloride 0.9 % bolus 1,000 mL (0 mLs Intravenous Stopped 11/15/19 2222)  morphine 4 MG/ML injection 4 mg (4 mg Intravenous Given 11/15/19 2131)  ondansetron (ZOFRAN) injection 4 mg (4 mg Intravenous Given 11/15/19 2131)  iohexol (OMNIPAQUE) 300 MG/ML solution 100 mL (100 mLs Intravenous Contrast Given 11/15/19 2145)    ED Course  I have reviewed the triage vital signs and the nursing notes.  Pertinent labs & imaging results that were available during my care of the patient were reviewed by me and considered in my medical decision making (see chart for details).    MDM Rules/Calculators/A&P                          Pt does have acute appendicitis. Pt given rocephin and zithromax.  Pt d/w Dr. Henreitta Leber (surgery) who will admit.  Plan for OR tomorrow morning.   Final Clinical Impression(s) / ED Diagnoses Final diagnoses:  Acute appendicitis with localized peritonitis, without perforation, abscess, or gangrene    Rx / DC Orders ED Discharge Orders    None       Jacalyn Lefevre, MD 11/15/19 2236

## 2019-11-15 NOTE — ED Notes (Signed)
Pt transported to CT ?

## 2019-11-16 ENCOUNTER — Observation Stay (HOSPITAL_COMMUNITY): Payer: 59 | Admitting: Anesthesiology

## 2019-11-16 ENCOUNTER — Encounter (HOSPITAL_COMMUNITY): Payer: Self-pay | Admitting: General Surgery

## 2019-11-16 ENCOUNTER — Encounter (HOSPITAL_COMMUNITY)
Admission: EM | Disposition: A | Discharge status: Home / Self Care | Payer: Self-pay | Source: Home / Self Care | Attending: Emergency Medicine

## 2019-11-16 DIAGNOSIS — K353 Acute appendicitis with localized peritonitis, without perforation or gangrene: Secondary | ICD-10-CM | POA: Diagnosis not present

## 2019-11-16 HISTORY — PX: LAPAROSCOPIC APPENDECTOMY: SHX408

## 2019-11-16 LAB — HIV ANTIBODY (ROUTINE TESTING W REFLEX): HIV Screen 4th Generation wRfx: NONREACTIVE

## 2019-11-16 SURGERY — APPENDECTOMY, LAPAROSCOPIC
Anesthesia: General

## 2019-11-16 MED ORDER — GLYCOPYRROLATE PF 0.2 MG/ML IJ SOSY
PREFILLED_SYRINGE | INTRAMUSCULAR | Status: AC
  Filled 2019-11-16: qty 1

## 2019-11-16 MED ORDER — OXYCODONE HCL 5 MG PO TABS
5.0000 mg | ORAL_TABLET | Freq: Once | ORAL | Status: AC | PRN
  Administered 2019-11-16: 5 mg via ORAL
  Filled 2019-11-16: qty 1

## 2019-11-16 MED ORDER — SUCCINYLCHOLINE CHLORIDE 200 MG/10ML IV SOSY
PREFILLED_SYRINGE | INTRAVENOUS | Status: DC | PRN
  Administered 2019-11-16: 120 mg via INTRAVENOUS

## 2019-11-16 MED ORDER — ONDANSETRON HCL 4 MG/2ML IJ SOLN
INTRAMUSCULAR | Status: DC | PRN
  Administered 2019-11-16: 4 mg via INTRAVENOUS

## 2019-11-16 MED ORDER — LIDOCAINE HCL (CARDIAC) PF 100 MG/5ML IV SOSY
PREFILLED_SYRINGE | INTRAVENOUS | Status: DC | PRN
  Administered 2019-11-16: 60 mg via INTRAVENOUS

## 2019-11-16 MED ORDER — DROPERIDOL 2.5 MG/ML IJ SOLN: 0.6250 mg | Freq: Once | INTRAMUSCULAR | Status: DC | PRN

## 2019-11-16 MED ORDER — PROPOFOL 10 MG/ML IV BOLUS
INTRAVENOUS | Status: AC
  Filled 2019-11-16: qty 40

## 2019-11-16 MED ORDER — DEXAMETHASONE SODIUM PHOSPHATE 4 MG/ML IJ SOLN
INTRAMUSCULAR | Status: DC | PRN
  Administered 2019-11-16: 8 mg via INTRAVENOUS

## 2019-11-16 MED ORDER — DOCUSATE SODIUM 100 MG PO CAPS: 100.0000 mg | ORAL_CAPSULE | Freq: Two times a day (BID) | ORAL | 0 refills | Status: DC | PRN

## 2019-11-16 MED ORDER — OXYCODONE HCL 5 MG PO TABS: 5.0000 mg | ORAL_TABLET | ORAL | 0 refills | Status: DC | PRN

## 2019-11-16 MED ORDER — PHENYLEPHRINE 40 MCG/ML (10ML) SYRINGE FOR IV PUSH (FOR BLOOD PRESSURE SUPPORT)
PREFILLED_SYRINGE | INTRAVENOUS | Status: AC
  Filled 2019-11-16: qty 10

## 2019-11-16 MED ORDER — MIDAZOLAM HCL 2 MG/2ML IJ SOLN
INTRAMUSCULAR | Status: AC
  Filled 2019-11-16: qty 2

## 2019-11-16 MED ORDER — PROPOFOL 10 MG/ML IV BOLUS
INTRAVENOUS | Status: DC | PRN
  Administered 2019-11-16: 120 mg via INTRAVENOUS

## 2019-11-16 MED ORDER — HYDROMORPHONE HCL 1 MG/ML IJ SOLN
0.2500 mg | INTRAMUSCULAR | Status: DC | PRN
  Administered 2019-11-16: 0.5 mg via INTRAVENOUS
  Filled 2019-11-16: qty 0.5

## 2019-11-16 MED ORDER — ONDANSETRON HCL 4 MG/2ML IJ SOLN: 4.0000 mg | Freq: Once | INTRAMUSCULAR | Status: DC | PRN

## 2019-11-16 MED ORDER — ONDANSETRON 4 MG PO TBDP: 4.0000 mg | ORAL_TABLET | Freq: Four times a day (QID) | ORAL | 0 refills | Status: DC | PRN

## 2019-11-16 MED ORDER — LACTATED RINGERS IV SOLN: INTRAVENOUS | Status: DC

## 2019-11-16 MED ORDER — ORAL CARE MOUTH RINSE: 15.0000 mL | Freq: Once | OROMUCOSAL | Status: AC

## 2019-11-16 MED ORDER — MEPERIDINE HCL 50 MG/ML IJ SOLN: 6.2500 mg | INTRAMUSCULAR | Status: DC | PRN

## 2019-11-16 MED ORDER — ROCURONIUM BROMIDE 100 MG/10ML IV SOLN
INTRAVENOUS | Status: DC | PRN
  Administered 2019-11-16: 10 mg via INTRAVENOUS
  Administered 2019-11-16: 30 mg via INTRAVENOUS

## 2019-11-16 MED ORDER — FENTANYL CITRATE (PF) 100 MCG/2ML IJ SOLN
INTRAMUSCULAR | Status: DC | PRN
  Administered 2019-11-16: 50 ug via INTRAVENOUS
  Administered 2019-11-16: 25 ug via INTRAVENOUS
  Administered 2019-11-16: 50 ug via INTRAVENOUS
  Administered 2019-11-16: 25 ug via INTRAVENOUS
  Administered 2019-11-16: 50 ug via INTRAVENOUS

## 2019-11-16 MED ORDER — BUPIVACAINE LIPOSOME 1.3 % IJ SUSP
INTRAMUSCULAR | Status: DC | PRN
  Administered 2019-11-16: 20 mL

## 2019-11-16 MED ORDER — ACETAMINOPHEN 10 MG/ML IV SOLN: 1000.0000 mg | Freq: Once | INTRAVENOUS | Status: DC | PRN

## 2019-11-16 MED ORDER — SUGAMMADEX SODIUM 200 MG/2ML IV SOLN
INTRAVENOUS | Status: DC | PRN
  Administered 2019-11-16: 130 mg via INTRAVENOUS

## 2019-11-16 MED ORDER — SODIUM CHLORIDE 0.9 % IV SOLN
INTRAVENOUS | Status: AC
  Filled 2019-11-16: qty 2

## 2019-11-16 MED ORDER — GLYCOPYRROLATE 0.2 MG/ML IJ SOLN
INTRAMUSCULAR | Status: DC | PRN
  Administered 2019-11-16 (×2): .1 mg via INTRAVENOUS

## 2019-11-16 MED ORDER — MIDAZOLAM HCL 2 MG/2ML IJ SOLN
2.0000 mg | INTRAMUSCULAR | Status: DC | PRN
  Administered 2019-11-16: 2 mg via INTRAVENOUS

## 2019-11-16 MED ORDER — BUPIVACAINE LIPOSOME 1.3 % IJ SUSP
INTRAMUSCULAR | Status: AC
  Filled 2019-11-16: qty 20

## 2019-11-16 MED ORDER — FENTANYL CITRATE (PF) 250 MCG/5ML IJ SOLN
INTRAMUSCULAR | Status: AC
  Filled 2019-11-16: qty 5

## 2019-11-16 MED ORDER — CHLORHEXIDINE GLUCONATE 0.12 % MT SOLN
15.0000 mL | Freq: Once | OROMUCOSAL | Status: AC
  Administered 2019-11-16: 15 mL via OROMUCOSAL

## 2019-11-16 MED ORDER — SODIUM CHLORIDE 0.9 % IR SOLN
Status: DC | PRN
  Administered 2019-11-16: 1000 mL

## 2019-11-16 MED ORDER — OXYCODONE HCL 5 MG/5ML PO SOLN: 5.0000 mg | Freq: Once | ORAL | Status: AC | PRN

## 2019-11-16 SURGICAL SUPPLY — 47 items
BAG RETRIEVAL 10 (BASKET) ×1
BAG RETRIEVAL 10MM (BASKET) ×1
BLADE SURG 15 STRL LF DISP TIS (BLADE) ×1 IMPLANT
BLADE SURG 15 STRL SS (BLADE) ×3
CHLORAPREP W/TINT 26 (MISCELLANEOUS) ×3 IMPLANT
CLOTH BEACON ORANGE TIMEOUT ST (SAFETY) ×3 IMPLANT
COVER LIGHT HANDLE STERIS (MISCELLANEOUS) ×6 IMPLANT
COVER WAND RF STERILE (DRAPES) ×3 IMPLANT
CUTTER FLEX LINEAR 45M (STAPLE) ×3 IMPLANT
DERMABOND ADVANCED (GAUZE/BANDAGES/DRESSINGS) ×2
DERMABOND ADVANCED .7 DNX12 (GAUZE/BANDAGES/DRESSINGS) ×1 IMPLANT
ELECT REM PT RETURN 9FT ADLT (ELECTROSURGICAL) ×3
ELECTRODE REM PT RTRN 9FT ADLT (ELECTROSURGICAL) ×1 IMPLANT
GLOVE BIO SURGEON STRL SZ 6.5 (GLOVE) ×2 IMPLANT
GLOVE BIO SURGEONS STRL SZ 6.5 (GLOVE) ×1
GLOVE BIOGEL PI IND STRL 6.5 (GLOVE) ×1 IMPLANT
GLOVE BIOGEL PI IND STRL 7.0 (GLOVE) ×3 IMPLANT
GLOVE BIOGEL PI INDICATOR 6.5 (GLOVE) ×2
GLOVE BIOGEL PI INDICATOR 7.0 (GLOVE) ×6
GOWN STRL REUS W/TWL LRG LVL3 (GOWN DISPOSABLE) ×6 IMPLANT
INST SET LAPROSCOPIC AP (KITS) ×3 IMPLANT
KIT TURNOVER KIT A (KITS) ×3 IMPLANT
MANIFOLD NEPTUNE II (INSTRUMENTS) ×3 IMPLANT
NEEDLE HYPO 18GX1.5 BLUNT FILL (NEEDLE) ×3 IMPLANT
NEEDLE HYPO 21X1.5 SAFETY (NEEDLE) ×3 IMPLANT
NEEDLE INSUFFLATION 14GA 120MM (NEEDLE) ×3 IMPLANT
NS IRRIG 1000ML POUR BTL (IV SOLUTION) ×3 IMPLANT
PACK LAP CHOLE LZT030E (CUSTOM PROCEDURE TRAY) ×3 IMPLANT
PAD ARMBOARD 7.5X6 YLW CONV (MISCELLANEOUS) ×3 IMPLANT
RELOAD 45 VASCULAR/THIN (ENDOMECHANICALS) IMPLANT
RELOAD STAPLE TA45 3.5 REG BLU (ENDOMECHANICALS) IMPLANT
SET BASIN LINEN APH (SET/KITS/TRAYS/PACK) ×6 IMPLANT
SET TUBE IRRIG SUCTION NO TIP (IRRIGATION / IRRIGATOR) IMPLANT
SET TUBE SMOKE EVAC HIGH FLOW (TUBING) ×3 IMPLANT
SHEARS HARMONIC ACE PLUS 36CM (ENDOMECHANICALS) ×3 IMPLANT
SUT MNCRL AB 4-0 PS2 18 (SUTURE) ×9 IMPLANT
SUT VICRYL 0 UR6 27IN ABS (SUTURE) ×6 IMPLANT
SYR 20ML LL LF (SYRINGE) ×6 IMPLANT
SYS BAG RETRIEVAL 10MM (BASKET) ×1
SYSTEM BAG RETRIEVAL 10MM (BASKET) ×1 IMPLANT
TRAY FOLEY W/BAG SLVR 16FR (SET/KITS/TRAYS/PACK) ×3
TRAY FOLEY W/BAG SLVR 16FR ST (SET/KITS/TRAYS/PACK) ×1 IMPLANT
TROCAR ENDO BLADELESS 11MM (ENDOMECHANICALS) ×3 IMPLANT
TROCAR ENDO BLADELESS 12MM (ENDOMECHANICALS) ×3 IMPLANT
TROCAR XCEL NON-BLD 5MMX100MML (ENDOMECHANICALS) ×3 IMPLANT
WARMER LAPAROSCOPE (MISCELLANEOUS) ×3 IMPLANT
YANKAUER SUCT 12FT TUBE ARGYLE (SUCTIONS) ×3 IMPLANT

## 2019-11-16 NOTE — Discharge Summary (Signed)
Physician Discharge Summary  Patient ID: Michelle Delgado MRN: [id number] DOB/AGE: 1975/07/22 44 y.o.  Admit date: 11/15/2019 Discharge date: 11/16/2019  Admission Diagnoses: Acute appendicitis   Discharge Diagnoses:  Principal Problem:   Acute appendicitis   Discharged Condition: good  Hospital Course: Michelle Delgado is a 44 yo with acute appendicitis s/p appendectomy. She did well post operatively and we sent her home from the PACU. She had adequate pain control and was tolerating some liquids.   Consults: None  Significant Diagnostic Studies: CT with acute appendicitis  Treatments: Surgery: 10/28 Laparosocpic appendectomy  Discharge Exam: Blood pressure 101/67, pulse 77, temperature 98.9 F (37.2 C), temperature source Oral, resp. rate 19, height 5\' 4"  (1.626 m), weight 145 lb (65.8 kg), last menstrual period 10/26/2019, SpO2 92 %.  Disposition: Discharge disposition: 01-Home or Self Care      Discharge Instructions     Call MD for:  difficulty breathing, headache or visual disturbances   Complete by: As directed    Call MD for:  extreme fatigue   Complete by: As directed    Call MD for:  persistant dizziness or light-headedness   Complete by: As directed    Call MD for:  persistant nausea and vomiting   Complete by: As directed    Call MD for:  redness, tenderness, or signs of infection (pain, swelling, redness, odor or green/yellow discharge around incision site)   Complete by: As directed    Call MD for:  severe uncontrolled pain   Complete by: As directed    Call MD for:  temperature >100.4   Complete by: As directed    Increase activity slowly   Complete by: As directed       Allergies as of 11/16/2019       Reactions   Sulfa Antibiotics Rash        Medication List     TAKE these medications    Advil 200 MG tablet Generic drug: ibuprofen Take 400 mg by mouth every 6 (six) hours as needed.   ALPRAZolam 0.25 MG tablet Commonly known as:  Xanax Take 1 tablet (0.25 mg total) by mouth at bedtime as needed for anxiety.   buPROPion 150 MG 24 hr tablet Commonly known as: Wellbutrin XL Take 1 tablet (150 mg total) by mouth daily.   docusate sodium 100 MG capsule Commonly known as: Colace Take 1 capsule (100 mg total) by mouth 2 (two) times daily as needed for mild constipation (while taking narcotic pain medication).   levothyroxine 112 MCG tablet Commonly known as: Synthroid Take 1 tablet (112 mcg total) by mouth daily.   Norgestimate-Ethinyl Estradiol Triphasic 0.18/0.215/0.25 MG-35 MCG tablet Commonly known as: Tri-Sprintec Take 1 tablet by mouth daily.   ondansetron 4 MG disintegrating tablet Commonly known as: ZOFRAN-ODT Take 1 tablet (4 mg total) by mouth every 6 (six) hours as needed for nausea.   oxyCODONE 5 MG immediate release tablet Commonly known as: Oxy IR/ROXICODONE Take 1 tablet (5 mg total) by mouth every 4 (four) hours as needed for severe pain or breakthrough pain.   vortioxetine HBr 10 MG Tabs tablet Commonly known as: Trintellix Take 1 tablet (10 mg total) by mouth daily.        Follow-up Information     02-16-1981, MD On 11/30/2019.   Specialty: General Surgery Why: post op phone call; if you need to be seen in person call the office  Contact information: [id number]11[address]2019 Dr Senaida Ores [facility] [phone]  Signed: Lucretia Roers 11/16/2019, 4:05 PM

## 2019-11-16 NOTE — H&P (Signed)
See Consult Note from 11/16/2019.  Algis Greenhouse, MD [facility] [address] [place], [place] [address] [phone] (office)

## 2019-11-16 NOTE — Consult Note (Addendum)
I was present with the medical student for this service. I personally verified the history of present illness, performed the physical exam, and made the plan for this encounter. I have verified the medical student's documentation and made modifications where appropriately. I have personally documented in my own words a brief history, physical, and plan below.     Acute appendicitis.   Tender RLQ Personally reviewed CT- acute appendicitis, dilated appendix   Discussed the risk of laparoscopic appendectomy and the option of antibiotics alone. Discussed that in [place] and some trials in the [place], antibiotics are used for simple appendicitis. Discussed that research shows a 40% failure rate for antibiotics alone.  Discussed risk of surgery including but not limited to bleeding, infection, injury to other organs, normal appendix, and after this discussion the patient has decided to proceed with lap appy.   Michelle Greenhouse, MD [facility] [address] [place], [place] [address] [phone] (office)    [facility] Consult  Reason for Consult: Acute uncomplicated appendicitis Referring Physician: Dr. Particia Nearing  Chief Complaint    Abdominal Pain      HPI: Michelle Delgado is a 44 y.o. female with hypothyroidism, and surgical hx significant for a cesarean section presenting with a one day history of worsening abdominal pain that began in the upper quadrants and progressed to the RLQ following a two week history of vague abdominal pain. She endorses associated periodic nausea yesterday, with no emesis; and intermittent chills. No recorded fevers. Patient tried reflux medicine without relief. Stooling without concern, with last BM yesterday morning. She denies home blood thinner use, and endorses a short-term, distant hx of tobacco use in college.  Past Medical History:  Diagnosis Date  . Allergy   . Anxiety   . Chronic headaches   . Depression    . Hypothyroidism     Past Surgical History:  Procedure Laterality Date  . CESAREAN SECTION  2005    Family History  Problem Relation Age of Onset  . Thyroid disease Mother   . Graves' disease Sister   . Aneurysm Maternal Grandmother     Social History   Tobacco Use  . Smoking status: Never Smoker  . Smokeless tobacco: Never Used  Substance Use Topics  . Alcohol use: Yes    Alcohol/week: 1.0 standard drink    Types: 1 Glasses of wine per week  . Drug use: No    Medications:  Prior to Admission:  Medications Prior to Admission  Medication Sig Dispense Refill Last Dose  . ALPRAZolam (XANAX) 0.25 MG tablet Take 1 tablet (0.25 mg total) by mouth at bedtime as needed for anxiety. 10 tablet 1 Past Month at Unknown time  . ibuprofen (ADVIL) 200 MG tablet Take 400 mg by mouth every 6 (six) hours as needed.   Past Week at Unknown time  . levothyroxine (SYNTHROID) 112 MCG tablet Take 1 tablet (112 mcg total) by mouth daily. 90 tablet 2 11/15/2019 at Unknown time  . vortioxetine HBr (TRINTELLIX) 10 MG TABS tablet Take 1 tablet (10 mg total) by mouth daily. 90 tablet 3 11/14/2019 at Unknown time  . buPROPion (WELLBUTRIN XL) 150 MG 24 hr tablet Take 1 tablet (150 mg total) by mouth daily. 90 tablet 3   . Norgestimate-Ethinyl Estradiol Triphasic (TRI-SPRINTEC) 0.18/0.215/0.25 MG-35 MCG tablet Take 1 tablet by mouth daily. (Patient not taking: Reported on 11/15/2019) 3 Package 4 Not Taking at Unknown time    Allergies  Allergen Reactions  . Sulfa  Antibiotics Rash     ROS:  Pertinent items are noted in HPI.  Blood pressure 115/78, pulse 79, temperature 98.6 F (37 C), temperature source Oral, resp. rate (!) 24, height 5\' 4"  (1.626 m), weight 65.8 kg, last menstrual period 10/26/2019, SpO2 95 %. Physical Exam Vitals reviewed.  Constitutional:      Appearance: She is not toxic-appearing or diaphoretic.  HENT:     Head: Normocephalic and atraumatic.  Eyes:     Extraocular  Movements: Extraocular movements intact.  Cardiovascular:     Rate and Rhythm: Normal rate and regular rhythm.  Pulmonary:     Effort: Pulmonary effort is normal.     Breath sounds: Normal breath sounds.  Abdominal:     General: There is no distension.     Palpations: Abdomen is soft.     Tenderness: There is abdominal tenderness in the right lower quadrant. Positive signs include Rovsing's sign.  Musculoskeletal:     Comments: Moves all extremities  Skin:    General: Skin is warm and dry.  Neurological:     General: No focal deficit present.     Mental Status: She is alert and oriented to person, place, and time.  Psychiatric:        Mood and Affect: Mood normal.        Behavior: Behavior normal.     Results: Results for orders placed or performed during the hospital encounter of 11/15/19 (from the past 48 hour(s))  Urinalysis, Routine w reflex microscopic Urine, Clean Catch     Status: Abnormal   Collection Time: 11/15/19  5:08 PM  Result Value Ref Range   Color, Urine YELLOW YELLOW   APPearance CLEAR CLEAR   Specific Gravity, Urine 1.024 1.005 - 1.030   pH 7.0 5.0 - 8.0   Glucose, UA NEGATIVE NEGATIVE mg/dL   Hgb urine dipstick NEGATIVE NEGATIVE   Bilirubin Urine NEGATIVE NEGATIVE   Ketones, ur 20 (A) NEGATIVE mg/dL   Protein, ur NEGATIVE NEGATIVE mg/dL   Nitrite NEGATIVE NEGATIVE   Leukocytes,Ua NEGATIVE NEGATIVE    Comment: Performed at [facility], [address], [place], [place]  Lipase, blood     Status: None   Collection Time: 11/15/19  6:07 PM  Result Value Ref Range   Lipase 22 11 - 51 U/L    Comment: Performed at [facility], [address] [place], [place]  Comprehensive metabolic panel     Status: Abnormal   Collection Time: 11/15/19  6:07 PM  Result Value Ref Range   Sodium 134 (L) 135 - 145 mmol/L   Potassium 4.2 3.5 - 5.1 mmol/L   Chloride 99 98 - 111 mmol/L   CO2 24 22 - 32 mmol/L   Glucose, Bld 105 (H) 70 - 99 mg/dL     Comment: Glucose reference range applies only to samples taken after fasting for at least 8 hours.   BUN 14 6 - 20 mg/dL   Creatinine, Ser 11/17/19 0.44 - 1.00 mg/dL   Calcium 9.7 8.9 - 2.77 mg/dL   Total Protein 8.0 6.5 - 8.1 g/dL   Albumin 4.4 3.5 - 5.0 g/dL   AST 15 15 - 41 U/L   ALT 12 0 - 44 U/L   Alkaline Phosphatase 60 38 - 126 U/L   Total Bilirubin 1.1 0.3 - 1.2 mg/dL   GFR, Estimated 82.4 >23 mL/min    Comment: (NOTE) Calculated using the CKD-EPI Creatinine Equation (2021)    Anion gap 11  5 - 15    Comment: Performed at [facility], [address], [place], [place] [address]  CBC     Status: Abnormal   Collection Time: 11/15/19  6:07 PM  Result Value Ref Range   WBC 15.7 (H) 4.0 - 10.5 K/uL   RBC 4.52 3.87 - 5.11 MIL/uL   Hemoglobin 14.3 12.0 - 15.0 g/dL   HCT 29.7 36 - 46 %   MCV 94.2 80.0 - 100.0 fL   MCH 31.6 26.0 - 34.0 pg   MCHC 33.6 30.0 - 36.0 g/dL   RDW 98.9 21.1 - 94.1 %   Platelets 280 150 - 400 K/uL   nRBC 0.0 0.0 - 0.2 %    Comment: Performed at [facility], [address], [place], [place] [address]  POC urine preg, ED     Status: None   Collection Time: 11/15/19  6:31 PM  Result Value Ref Range   Preg Test, Ur NEGATIVE NEGATIVE    Comment:        THE SENSITIVITY OF THIS METHODOLOGY IS >24 mIU/mL   Respiratory Panel by RT PCR (Flu A&B, Covid) - Nasopharyngeal Swab     Status: None   Collection Time: 11/15/19 10:11 PM   Specimen: Nasopharyngeal Swab  Result Value Ref Range   SARS Coronavirus 2 by RT PCR NEGATIVE NEGATIVE    Comment: (NOTE) SARS-CoV-2 target nucleic acids are NOT DETECTED.  The SARS-CoV-2 RNA is generally detectable in upper respiratoy specimens during the acute phase of infection. The lowest concentration of SARS-CoV-2 viral copies this assay can detect is 131 copies/mL. A negative result does not preclude SARS-Cov-2 infection and should not be used as the sole basis for treatment or other patient management decisions. A negative  result may occur with  improper specimen collection/handling, submission of specimen other than nasopharyngeal swab, presence of viral mutation(s) within the areas targeted by this assay, and inadequate number of viral copies (<131 copies/mL). A negative result must be combined with clinical observations, patient history, and epidemiological information. The expected result is Negative.  Fact Sheet for Patients:  https://www.moore.com/  Fact Sheet for Healthcare Providers:  https://www.young.biz/  This test is no t yet approved or cleared by the [place] FDA and  has been authorized for detection and/or diagnosis of SARS-CoV-2 by FDA under an Emergency Use Authorization (EUA). This EUA will remain  in effect (meaning this test can be used) for the duration of the COVID-19 declaration under Section 564(b)(1) of the Act, 21 U.S.C. section 360bbb-3(b)(1), unless the authorization is terminated or revoked sooner.     Influenza A by PCR NEGATIVE NEGATIVE   Influenza B by PCR NEGATIVE NEGATIVE    Comment: (NOTE) The Xpert Xpress SARS-CoV-2/FLU/RSV assay is intended as an aid in  the diagnosis of influenza from Nasopharyngeal swab specimens and  should not be used as a sole basis for treatment. Nasal washings and  aspirates are unacceptable for Xpert Xpress SARS-CoV-2/FLU/RSV  testing.  Fact Sheet for Patients: https://www.moore.com/  Fact Sheet for Healthcare Providers: https://www.young.biz/  This test is not yet approved or cleared by the [place] FDA and  has been authorized for detection and/or diagnosis of SARS-CoV-2 by  FDA under an Emergency Use Authorization (EUA). This EUA will remain  in effect (meaning this test can be used) for the duration of the  Covid-19 declaration under Section 564(b)(1) of the Act, 21  U.S.C. section 360bbb-3(b)(1), unless the authorization is  terminated or  revoked. Performed at [facility]  [facility], [address] [place], [place] [id number]     CT ABDOMEN PELVIS W CONTRAST  Result Date: 11/15/2019 CLINICAL DATA:  44 year old female with right lower quadrant abdominal pain. EXAM: CT ABDOMEN AND PELVIS WITH CONTRAST TECHNIQUE: Multidetector CT imaging of the abdomen and pelvis was performed using the standard protocol following bolus administration of intravenous contrast. CONTRAST:  100mL OMNIPAQUE IOHEXOL 300 MG/ML  SOLN COMPARISON:  None. FINDINGS: Lower chest: The visualized lung bases are clear. No intra-abdominal free air or free fluid. Hepatobiliary: No focal liver abnormality is seen. No gallstones, gallbladder wall thickening, or biliary dilatation. Pancreas: Unremarkable. No pancreatic ductal dilatation or surrounding inflammatory changes. Spleen: Normal in size without focal abnormality. Adrenals/Urinary Tract: The adrenal glands unremarkable. There is no hydronephrosis on either side. There is symmetric enhancement and excretion of contrast by both kidneys. The visualized ureters and urinary bladder appear unremarkable. Stomach/Bowel: There is no bowel obstruction. The appendix is enlarged and inflamed measuring 12 mm in diameter. There is enhancement of the appendiceal mucosa consistent with hyperemia. The appendix is located in the right lower quadrant medial to the cecum and anterior to the right psoas muscle. No evidence of perforation or abscess. Vascular/Lymphatic: The abdominal aorta and IVC are unremarkable. No portal venous gas. There is no adenopathy. Reproductive: The uterus is anteverted and grossly unremarkable. There is a 15 mm left ovarian dominant follicle or corpus luteum. The right ovary is unremarkable. Other: None Musculoskeletal: No acute or significant osseous findings. IMPRESSION: Acute appendicitis. No abscess or perforation. Electronically Signed   By: Elgie CollardArash  Radparvar M.D.   On: 11/15/2019 22:03     Assessment & Plan:  Michelle Delgado is a 44 y.o. female with hx of hypothyroidism and c-section, presenting with acute, worsening abdominal pain that progressed to the RLQ, with associated nausea; notably afebrile though with elevated WBC, with normal BM; with CT scan significant for acute appendicitis with no evidence of perforation or abscess.   -Appendectomy scheduled -Tylenol PRN for pain; plan to discharge on prescription narcotic analgesics for breakthrough pain as needed -Plan for discharge later today   All questions were answered to the satisfaction of the patient and family. Risks and benefits discussed.  Patient informed of treatment options and risks, and desires an appendectomy.   Michelle Eulis ManlyC Delgado 11/16/2019, 8:52 AM

## 2019-11-16 NOTE — Discharge Instructions (Signed)
Discharge Laparoscopic Surgery Instructions:  Common Complaints: Right shoulder pain is common after laparoscopic surgery. This is secondary to the gas used in the surgery being trapped under the diaphragm.  Walk to help your body absorb the gas. This will improve in a few days. Pain at the port sites are common, especially the larger port sites. This will improve with time.  Some nausea is common and poor appetite. The main goal is to stay hydrated the first few days after surgery.   Diet/ Activity: Diet as tolerated. You may not have an appetite, but it is important to stay hydrated. Drink 64 ounces of water a day. Your appetite will return with time.  Shower per your regular routine daily.  Do not take hot showers. Take warm showers that are less than 10 minutes. Rest and listen to your body, but do not remain in bed all day.  Walk everyday for at least 15-20 minutes. Deep cough and move around every 1-2 hours in the first few days after surgery.  Do not lift > 10 lbs, perform excessive bending, pushing, pulling, squatting for 1-2 weeks after surgery.  Do not pick at the dermabond glue on your incision sites.  This glue film will remain in place for 1-2 weeks and will start to peel off.  Do not place lotions or balms on your incision unless instructed to specifically by Dr. Kamill Fulbright.   Pain Expectations and Narcotics: -After surgery you will have pain associated with your incisions and this is normal. The pain is muscular and nerve pain, and will get better with time. -You are encouraged and expected to take non narcotic medications like tylenol and ibuprofen (when able) to treat pain as multiple modalities can aid with pain treatment. -Narcotics are only used when pain is severe or there is breakthrough pain. -You are not expected to have a pain score of 0 after surgery, as we cannot prevent pain. A pain score of 3-4 that allows you to be functional, move, walk, and tolerate some activity is  the goal. The pain will continue to improve over the days after surgery and is dependent on your surgery. -Due to [place] law, we are only able to give a certain amount of pain medication to treat post operative pain, and we only give additional narcotics on a patient by patient basis.  -For most laparoscopic surgery, studies have shown that the majority of patients only need 10-15 narcotic pills, and for open surgeries most patients only need 15-20.   -Having appropriate expectations of pain and knowledge of pain management with non narcotics is important as we do not want anyone to become addicted to narcotic pain medication.  -Using ice packs in the first 48 hours and heating pads after 48 hours, wearing an abdominal binder (when recommended), and using over the counter medications are all ways to help with pain management.   -Simple acts like meditation and mindfulness practices after surgery can also help with pain control and research has proven the benefit of these practices.  Medication: Take tylenol and ibuprofen as needed for pain control, alternating every 4-6 hours.  Example:  Tylenol 1000mg @ 6am, 12noon, 6pm, 12midnight (Do not exceed 4000mg of tylenol a day). Ibuprofen 800mg @ 9am, 3pm, 9pm, 3am (Do not exceed 3600mg of ibuprofen a day).  Take Roxicodone for breakthrough pain every 4 hours.  Take Colace for constipation related to narcotic pain medication. If you do not have a bowel movement in 2 days, take Miralax   over the counter.  Drink plenty of water to also prevent constipation.   Contact Information: If you have questions or concerns, please call our office, [phone], Monday- Thursday 8AM-5PM and Friday 8AM-12Noon.  If it is after hours or on the weekend, please call [facility] Main [facility], [phone], and ask to speak to the surgeon on call for Dr. Henreitta Leber at [facility].     Laparoscopic Appendectomy, Adult, Care After This sheet gives you information about how to care for  yourself after your procedure. Your health care provider may also give you more specific instructions. If you have problems or questions, contact your health care provider. What can I expect after the procedure? After the procedure, it is common to have:  Little energy for normal activities.  Mild pain in the area where the incisions were made.  Difficulty passing stool (constipation). This can be caused by: ? Pain medicine. ? A decrease in your activity. Follow these instructions at home: Medicines  Take over-the-counter and prescription medicines only as told by your health care provider.  If you were prescribed an antibiotic medicine, take it as told by your health care provider. Do not stop taking the antibiotic even if you start to feel better.  Do not drive or use heavy machinery while taking prescription pain medicine.  Ask your health care provider if the medicine prescribed to you can cause constipation. You may need to take steps to prevent or treat constipation, such as: ? Drink enough fluid to keep your urine pale yellow. ? Take over-the-counter or prescription medicines. ? Eat foods that are high in fiber, such as beans, whole grains, and fresh fruits and vegetables. ? Limit foods that are high in fat and processed sugars, such as fried or sweet foods. Incision care   Follow instructions from your health care provider about how to take care of your incisions. Make sure you: ? Wash your hands with soap and water before and after you change your bandage (dressing). If soap and water are not available, use hand sanitizer. ? Change your dressing as told by your health care provider. ? Leave stitches (sutures), skin glue, or adhesive strips in place. These skin closures may need to stay in place for 2 weeks or longer. If adhesive strip edges start to loosen and curl up, you may trim the loose edges. Do not remove adhesive strips completely unless your health care provider tells  you to do that.  Check your incision areas every day for signs of infection. Check for: ? Redness, swelling, or pain. ? Fluid or blood. ? Warmth. ? Pus or a bad smell. Bathing  Keep your incisions clean and dry. Clean them as often as told by your health care provider. To do this: 1. Gently wash the incisions with soap and water. 2. Rinse the incisions with water to remove all soap. 3. Pat the incisions dry with a clean towel. Do not rub the incisions.  Do not take baths, swim, or use a hot tub for 2 weeks, or until your health care provider approves.   You may shower. Activity   Do not drive for 24 hours if you were given a sedative during your procedure.  Rest after the procedure. Return to your normal activities as told by your health care provider. Ask your health care provider what activities are safe for you.  For 3 weeks, or for as long as told by your health care provider: ? Do not lift anything that is heavier  than 10 lb (4.5 kg), or the limit that you are told. ? Do not play contact sports. General instructions  If you were sent home with a drain, follow instructions from your health care provider about how to care for it.  Take deep breaths. This helps to prevent your lungs from developing an infection (pneumonia).  Keep all follow-up visits as told by your health care provider. This is important. Contact a health care provider if:  You have redness, swelling, or pain around an incision.  You have fluid or blood coming from an incision.  Your incision feels warm to the touch.  You have pus or a bad smell coming from an incision or dressing.  Your incision edges break open after your sutures have been removed.  You have increasing pain in your shoulders.  You feel dizzy or you faint.  You develop shortness of breath.  You keep feeling nauseous or you are vomiting.  You have diarrhea or you cannot control your bowel functions.  You lose your  appetite.  You develop swelling or pain in your legs.  You develop a rash. Get help right away if you have:  A fever.  Difficulty breathing.  Sharp pains in your chest. Summary  After a laparoscopic appendectomy, it is common to have little energy for normal activities, mild pain in the area of the incisions, and constipation.  Infection is the most common complication after this procedure. Follow your health care provider's instructions about caring for yourself after the procedure.  Rest after the procedure. Return to your normal activities as told by your health care provider.  Contact your health care provider if you notice signs of infection around your incisions or you develop shortness of breath. Get help right away if you have a fever, chest pain, or difficulty breathing. This information is not intended to replace advice given to you by your health care provider. Make sure you discuss any questions you have with your health care provider. Document Revised: 07/08/2017 Document Reviewed: 07/08/2017 Elsevier Patient Education  2020 [employer].

## 2019-11-16 NOTE — Anesthesia Preprocedure Evaluation (Signed)
Anesthesia Evaluation  Patient identified by MRN, date of birth, ID band Patient awake    Reviewed: Allergy & Precautions, NPO status , Patient's Chart, lab work & pertinent test results, reviewed documented beta blocker date and time   History of Anesthesia Complications Negative for: history of anesthetic complications  Airway Mallampati: II  TM Distance: >3 FB Neck ROM: Full    Dental no notable dental hx.    Pulmonary neg pulmonary ROS,    Pulmonary exam normal breath sounds clear to auscultation       Cardiovascular negative cardio ROS Normal cardiovascular exam Rhythm:Regular Rate:Normal     Neuro/Psych  Headaches, Anxiety Depression    GI/Hepatic Neg liver ROS, RLQ pain   Endo/Other  Hypothyroidism   Renal/GU negative Renal ROS     Musculoskeletal   Abdominal   Peds  Hematology   Anesthesia Other Findings   Reproductive/Obstetrics                             Anesthesia Physical Anesthesia Plan  ASA: II  Anesthesia Plan: General   Post-op Pain Management:    Induction: Intravenous  PONV Risk Score and Plan:   Airway Management Planned: Oral ETT  Additional Equipment:   Intra-op Plan:   Post-operative Plan: Extubation in OR  Informed Consent: I have reviewed the patients History and Physical, chart, labs and discussed the procedure including the risks, benefits and alternatives for the proposed anesthesia with the patient or authorized representative who has indicated his/her understanding and acceptance.     Dental advisory given  Plan Discussed with: CRNA  Anesthesia Plan Comments:         Anesthesia Quick Evaluation

## 2019-11-16 NOTE — Anesthesia Procedure Notes (Signed)
Procedure Name: Intubation Date/Time: 11/16/2019 9:32 AM Performed by: Georgeanne Nim, CRNA Pre-anesthesia Checklist: Patient identified, Emergency Drugs available, Suction available, Patient being monitored and Timeout performed Patient Re-evaluated:Patient Re-evaluated prior to induction Oxygen Delivery Method: Circle system utilized Preoxygenation: Pre-oxygenation with 100% oxygen Induction Type: IV induction Ventilation: Mask ventilation without difficulty Laryngoscope Size: Mac and 4 Grade View: Grade I Tube type: Oral Tube size: 7.0 mm Number of attempts: 1 Airway Equipment and Method: Stylet Placement Confirmation: ETT inserted through vocal cords under direct vision,  positive ETCO2,  CO2 detector and breath sounds checked- equal and bilateral Secured at: 19 cm Tube secured with: Tape Dental Injury: Teeth and Oropharynx as per pre-operative assessment

## 2019-11-16 NOTE — Progress Notes (Signed)
[facility]  Spoke with husband and notified surgery completed. Can go home after PACU if feels ok. Rx sent to [facility]. Post op phone call in 2 weeks.  Algis Greenhouse, MD [facility] [address] [place], [place] [address] [phone] (office)

## 2019-11-16 NOTE — Anesthesia Postprocedure Evaluation (Signed)
Anesthesia Post Note  Patient: Michelle Delgado  Procedure(s) Performed: APPENDECTOMY LAPAROSCOPIC (N/A )  Patient location during evaluation: PACU Anesthesia Type: General Level of consciousness: awake and alert Pain management: pain level controlled Vital Signs Assessment: post-procedure vital signs reviewed and stable Respiratory status: spontaneous breathing Cardiovascular status: stable Postop Assessment: no apparent nausea or vomiting Anesthetic complications: no   No complications documented.   Last Vitals:  Vitals:   11/16/19 0813 11/16/19 1030  BP: 115/78 120/77  Pulse: 79 97  Resp: (!) 24 (!) 25  Temp: 37 C (P) 37.7 C  SpO2: 95% 97%    Last Pain:  Vitals:   11/16/19 0813  TempSrc: Oral  PainSc: 5                  Edison Pace

## 2019-11-16 NOTE — Op Note (Signed)
[facility]  Date of Surgery: 11/16/2019  Admit Date: 11/15/2019   Performing Service: General  Surgeon(s) and Role:    * Lucretia Roers, MD - Primary   Pre-operative Diagnosis: Acute Appendicitis  Post-operative Diagnosis: Acute Appendicitis  Procedure Performed: Laparoscopic Appendectomy   Surgeon: Leatrice Jewels. Henreitta Leber, MD   Assistant: No qualified resident was available.   Anesthesia: General   Findings:  The appendix was found to be inflamed. There were not signs of necrosis. There was not perforation. There was not abscess formation.   Estimated Blood Loss: Minimal   Specimens:  ID Type Source Tests Collected by Time Destination  1 : Appendix Tissue PATH Appendix SURGICAL PATHOLOGY Lucretia Roers, MD 11/16/2019 1021      Complications: None; patient tolerated the procedure well.   Disposition: PACU - hemodynamically stable.   Condition: stable   Indications: The patient presented with a 1 day history of right-sided abdominal pain. A CT revealed findings consistent with acute appendicitis.   Procedure Details  Prior to the procedure, the risks, benefits, complications, treatment options, and expected outcomes were discussed with the patient and/or family, including but not limited to the risk of bleeding, infection, finding of a normal appendix, and the need for conversion to an open procedure. There was concurrence with the proposed plan and informed consent was obtained. The patient was taken to the operating room, identified as Michelle Delgado and the procedure verified as Laproscopic Appendectomy.    The patient was placed in the supine position and general anesthesia was induced, along with placement of orogastric tube, SCD's, and a Foley catheter. The abdomen was prepped and draped in a sterile fashion. The abdomen was entered with Veress technique in the infraumbilical incision. Intraperitoneal placement was confirmed with saline drop,  low entry pressures, and easy insufflation. A 11 mm optiview trocar was placed under direct visualization with a 0 degree scope. The 10 mm 0 degree scope was placed in the abdomen and no evidence of injury was identified. A 12 mm port was placed in the left lower quadrant of the abdomen after skin incision with trocar placement under direct vision. A careful evaluation of the entire abdomen was carried out. There was a small omental adhesion in the lower abdomen that was taken down with the harmonic. An additional 5 mm port was placed in the suprapubic area under direct vision.  The patient was placed in Trendelenburg and left lateral decubitus position. The small intestines were retracted in the cephalad and left lateral direction away from the pelvis and right lower quadrant. The patient was found to have an dilated, inflamed appendix. There was not evidence of perforation.   The appendix was carefully dissected. The mesoappendix was taken with the Harmonic to the base of the appendix. The appendix was divided at its base a standard endo-GIA stapler. Minimal appendiceal stump was left in place.  The appendix was placed within an Endocatch specimen bag. There was no evidence of bleeding, leakage, or complication after division of the appendix.  Any remaining blood was suctioned out from the abdomen, hemostasis was confirmed. The endocatch bag was removed via the 12 mm port, then the abdomen desufflated. The appendix was passed off the field as a specimen.   The the 12 mm and 11 mm port sites were closed with a 0 Vicryl suture. The trocar site skin wounds were closed using subcuticular 4-0 Monocryl suture and dermabond. The patient was then awakened from general anesthesia, extubated, and  taken to PACU for recovery.   Instrument, sponge, and needle counts were correct at the conclusion of the case.   Algis Greenhouse, MD [facility] [address], [place]  [address] [phone](ITJLLV)

## 2019-11-16 NOTE — Transfer of Care (Signed)
Immediate Anesthesia Transfer of Care Note  Patient: Michelle Delgado  Procedure(s) Performed: APPENDECTOMY LAPAROSCOPIC (N/A )  Patient Location: PACU  Anesthesia Type:General  Level of Consciousness: awake, alert , oriented and patient cooperative  Airway & Oxygen Therapy: Patient Spontanous Breathing  Post-op Assessment: Report given to RN and Post -op Vital signs reviewed and stable  Post vital signs: Reviewed and stable  Last Vitals:  Vitals Value Taken Time  BP 120/77 11/16/19 1031  Temp    Pulse 92 11/16/19 1033  Resp 25 11/16/19 1033  SpO2 98 % 11/16/19 1033  Vitals shown include unvalidated device data.  Last Pain:  Vitals:   11/16/19 0813  TempSrc: Oral  PainSc: 5       Patients Stated Pain Goal: 8 (11/16/19 0813)  Complications: No complications documented.

## 2019-11-17 ENCOUNTER — Encounter (HOSPITAL_COMMUNITY): Payer: Self-pay | Admitting: General Surgery

## 2019-11-17 LAB — SURGICAL PATHOLOGY

## 2019-11-30 ENCOUNTER — Other Ambulatory Visit: Payer: Self-pay

## 2019-11-30 ENCOUNTER — Telehealth (INDEPENDENT_AMBULATORY_CARE_PROVIDER_SITE_OTHER): Payer: 59 | Admitting: General Surgery

## 2019-11-30 DIAGNOSIS — K353 Acute appendicitis with localized peritonitis, without perforation or gangrene: Secondary | ICD-10-CM

## 2019-11-30 NOTE — Progress Notes (Signed)
[facility]  I am calling the patient for post operative evaluation. This is not a billable encounter as it is under the global charges for the surgery.  The patient had a laparoscopic appendectomy on 11/16/2019. The patient reports that she  is doing well and her dermabond is peeling off. The are tolerating a diet, having good pain control, and having regular Bms.   The patient has no concerns about surgery but is concerned about the pathology.  I have discussed the pathology with her and Gyn and will send the information to Jannifer Rodney, FNP. When I discussed with Gyn no concern for the focal endosalpingosis as it is a benign process but patient and myself have read that there is association with finding this and associated gyn malignancies. CT demonstrated a enlarged 41mm ovarian follicle but otherwise no concerns features.   Pathology: FINAL MICROSCOPIC DIAGNOSIS:   A. APPENDIX, APPENDECTOMY:  - Acute appendicitis  - Focal endosalpingosis involving subserosa   Will see the patient PRN. Will send PCP message.   Algis Greenhouse, MD [facility] [address] [place], [place] [address] [phone] (office)

## 2020-01-02 ENCOUNTER — Other Ambulatory Visit: Payer: Self-pay | Admitting: Family

## 2020-01-15 ENCOUNTER — Other Ambulatory Visit: Payer: Self-pay | Admitting: Family

## 2020-01-15 MED ORDER — SYNTHROID 112 MCG PO TABS: 112.0000 ug | ORAL_TABLET | Freq: Every day | ORAL | 1 refills | Status: DC

## 2020-04-17 ENCOUNTER — Ambulatory Visit: Payer: 59 | Admitting: Nurse Practitioner

## 2020-04-24 ENCOUNTER — Encounter: Payer: Self-pay | Admitting: Family

## 2020-04-24 ENCOUNTER — Ambulatory Visit: Payer: 59 | Admitting: Family

## 2020-04-24 ENCOUNTER — Other Ambulatory Visit: Payer: Self-pay

## 2020-04-24 VITALS — BP 122/81 | HR 61 | Temp 97.9°F | Ht 64.0 in | Wt 151.8 lb

## 2020-04-24 DIAGNOSIS — Z3041 Encounter for surveillance of contraceptive pills: Secondary | ICD-10-CM

## 2020-04-24 DIAGNOSIS — Z1159 Encounter for screening for other viral diseases: Secondary | ICD-10-CM | POA: Diagnosis not present

## 2020-04-24 DIAGNOSIS — Z0001 Encounter for general adult medical examination with abnormal findings: Secondary | ICD-10-CM

## 2020-04-24 DIAGNOSIS — N838 Other noninflammatory disorders of ovary, fallopian tube and broad ligament: Secondary | ICD-10-CM

## 2020-04-24 DIAGNOSIS — F39 Unspecified mood [affective] disorder: Secondary | ICD-10-CM | POA: Diagnosis not present

## 2020-04-24 DIAGNOSIS — F320 Major depressive disorder, single episode, mild: Secondary | ICD-10-CM

## 2020-04-24 DIAGNOSIS — E039 Hypothyroidism, unspecified: Secondary | ICD-10-CM

## 2020-04-24 DIAGNOSIS — Z0000 Encounter for general adult medical examination without abnormal findings: Secondary | ICD-10-CM

## 2020-04-24 MED ORDER — NORGESTIM-ETH ESTRAD TRIPHASIC 0.18/0.215/0.25 MG-35 MCG PO TABS: 1.0000 | ORAL_TABLET | Freq: Every day | ORAL | 4 refills | Status: DC

## 2020-04-24 NOTE — Progress Notes (Signed)
Subjective:    Patient ID: Michelle Delgado, female    DOB: [birth year], 45 y.o.   MRN: [id number]  Chief Complaint  Patient presents with  . Hypothyroidism   PT presents to the office today for CPE without pap. She had her appendix removed in 11/16/19 and states her abdominal pain is greatly improved.  It was found to have focal endosalpingosis and her surgeon consulted with GYN. PT states this does make her nervous because association with finding this and associated gyn malignancies.  She also needs a refill on her OC. She is sexually active with the same partner.  Anxiety Presents for follow-up visit. Symptoms include excessive worry, irritability, nervous/anxious behavior and restlessness. Symptoms occur most days. The severity of symptoms is moderate.    Depression        This is a chronic problem.  The current episode started more than 1 year ago.   The problem occurs intermittently.  Associated symptoms include restlessness.  Past medical history includes thyroid problem and anxiety.   Thyroid Problem Presents for follow-up visit. Symptoms include anxiety and constipation. The symptoms have been stable.      Review of Systems  Constitutional: Positive for irritability.  Gastrointestinal: Positive for constipation.  Psychiatric/Behavioral: Positive for depression. The patient is nervous/anxious.   All other systems reviewed and are negative.  Family History  Problem Relation Age of Onset  . Thyroid disease Mother   . Graves' disease Sister   . Aneurysm Maternal Grandmother    Social History   Socioeconomic History  . Marital status: Married    Spouse name: Not on file  . Number of children: Not on file  . Years of education: Not on file  . Highest education level: Not on file  Occupational History  . Not on file  Tobacco Use  . Smoking status: Never Smoker  . Smokeless tobacco: Never Used  Substance and Sexual Activity  . Alcohol use: Yes    Alcohol/week: 1.0  standard drink    Types: 1 Glasses of wine per week  . Drug use: No  . Sexual activity: Yes    Birth control/protection: Pill  Other Topics Concern  . Not on file  Social History Narrative  . Not on file   Social Determinants of Health   Financial Resource Strain: Not on file  Food Insecurity: Not on file  Transportation Needs: Not on file  Physical Activity: Not on file  Stress: Not on file  Social Connections: Not on file       Objective:   Physical Exam Vitals reviewed.  Constitutional:      General: She is not in acute distress.    Appearance: She is well-developed.  HENT:     Head: Normocephalic and atraumatic.     Right Ear: Tympanic membrane normal.     Left Ear: Tympanic membrane normal.  Eyes:     Pupils: Pupils are equal, round, and reactive to light.  Neck:     Thyroid: No thyromegaly.  Cardiovascular:     Rate and Rhythm: Normal rate and regular rhythm.     Heart sounds: Normal heart sounds. No murmur heard.   Pulmonary:     Effort: Pulmonary effort is normal. No respiratory distress.     Breath sounds: Normal breath sounds. No wheezing.  Abdominal:     General: Bowel sounds are normal. There is no distension.     Palpations: Abdomen is soft.     Tenderness: There is no  abdominal tenderness.  Musculoskeletal:        General: No tenderness. Normal range of motion.     Cervical back: Normal range of motion and neck supple.  Skin:    General: Skin is warm and dry.  Neurological:     Mental Status: She is alert and oriented to person, place, and time.     Cranial Nerves: No cranial nerve deficit.     Deep Tendon Reflexes: Reflexes are normal and symmetric.  Psychiatric:        Behavior: Behavior normal.        Thought Content: Thought content normal.        Judgment: Judgment normal.       BP 122/81   Pulse 61   Temp 97.9 F (36.6 C) (Temporal)   Ht '5\' 4"'  (1.626 m)   Wt 151 lb 12.8 oz (68.9 kg)   BMI 26.06 kg/m      Assessment & Plan:   [name] comes in today with chief complaint of Hypothyroidism   Diagnosis and orders addressed:  1. Annual physical exam - CMP14+EGFR - CBC with Differential/Platelet - Lipid panel - TSH - Hepatitis C antibody  2. Hypothyroidism, unspecified type - CMP14+EGFR - CBC with Differential/Platelet - TSH  3. Depression, major, single episode, mild (HCC) - CMP14+EGFR - CBC with Differential/Platelet  4. Mood disorder (HCC) - CMP14+EGFR - CBC with Differential/Platelet  5. 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 Encounter for birth control pills maintenance - Norgestimate-Ethinyl Estradiol Triphasic (TRI-SPRINTEC) 0.18/0.215/0.25 MG-35 MCG tablet; Take 1 tablet by mouth daily.  Dispense: 90 tablet; Refill: 4  Long discussion about GAD and depression.  She reports she has tried multiple medications, but they always seem "too strong". We discussed GeneSight testing. Handout given. She will call and make an appointment for this and will add medication according to this.  Labs pending Health Maintenance reviewed Diet and exercise encouraged  Follow up plan: 6 months    Evelina Dun, FNP

## 2020-04-24 NOTE — Patient Instructions (Signed)
[email or web address].NIH.Gov">  Generalized Anxiety Disorder, Adult Generalized anxiety disorder (GAD) is a mental health condition. Unlike normal worries, anxiety related to GAD is not triggered by a specific event. These worries do not fade or get better with time. GAD interferes with relationships, work, and school. GAD symptoms can vary from mild to severe. People with severe GAD can have intense waves of anxiety with physical symptoms that are similar to panic attacks. What are the causes? The exact cause of GAD is not known, but the following are believed to have an impact:  Differences in natural brain chemicals.  Genes passed down from parents to children.  Differences in the way threats are perceived.  Development during childhood.  Personality. What increases the risk? The following factors may make you more likely to develop this condition:  Being female.  Having a family history of anxiety disorders.  Being very shy.  Experiencing very stressful life events, such as the death of a loved one.  Having a very stressful family environment. What are the signs or symptoms? People with GAD often worry excessively about many things in their lives, such as their health and family. Symptoms may also include:  Mental and emotional symptoms: ? Worrying excessively about natural disasters. ? Fear of being late. ? Difficulty concentrating. ? Fears that others are judging your performance.  Physical symptoms: ? Fatigue. ? Headaches, muscle tension, muscle twitches, trembling, or feeling shaky. ? Feeling like your heart is pounding or beating very fast. ? Feeling out of breath or like you cannot take a deep breath. ? Having trouble falling asleep or staying asleep, or experiencing restlessness. ? Sweating. ? Nausea, diarrhea, or irritable bowel syndrome (IBS).  Behavioral symptoms: ? Experiencing erratic moods or irritability. ? Avoidance of new situations. ? Avoidance of  people. ? Extreme difficulty making decisions. How is this diagnosed? This condition is diagnosed based on your symptoms and medical history. You will also have a physical exam. Your health care provider may perform tests to rule out other possible causes of your symptoms. To be diagnosed with GAD, a person must have anxiety that:  Is out of his or her control.  Affects several different aspects of his or her life, such as work and relationships.  Causes distress that makes him or her unable to take part in normal activities.  Includes at least three symptoms of GAD, such as restlessness, fatigue, trouble concentrating, irritability, muscle tension, or sleep problems. Before your health care provider can confirm a diagnosis of GAD, these symptoms must be present more days than they are not, and they must last for 6 months or longer. How is this treated? This condition may be treated with:  Medicine. Antidepressant medicine is usually prescribed for long-term daily control. Anti-anxiety medicines may be added in severe cases, especially when panic attacks occur.  Talk therapy (psychotherapy). Certain types of talk therapy can be helpful in treating GAD by providing support, education, and guidance. Options include: ? Cognitive behavioral therapy (CBT). People learn coping skills and self-calming techniques to ease their physical symptoms. They learn to identify unrealistic thoughts and behaviors and to replace them with more appropriate thoughts and behaviors. ? Acceptance and commitment therapy (ACT). This treatment teaches people how to be mindful as a way to cope with unwanted thoughts and feelings. ? Biofeedback. This process trains you to manage your body's response (physiological response) through breathing techniques and relaxation methods. You will work with a therapist while machines are used to monitor your physical   symptoms.  Stress management techniques. These include yoga,  meditation, and exercise. A mental health specialist can help determine which treatment is best for you. Some people see improvement with one type of therapy. However, other people require a combination of therapies.   Follow these instructions at home: Lifestyle  Maintain a consistent routine and schedule.  Anticipate stressful situations. Create a plan, and allow extra time to work with your plan.  Practice stress management or self-calming techniques that you have learned from your therapist or your health care provider. General instructions  Take over-the-counter and prescription medicines only as told by your health care provider.  Understand that you are likely to have setbacks. Accept this and be kind to yourself as you persist to take better care of yourself.  Recognize and accept your accomplishments, even if you judge them as small.  Keep all follow-up visits as told by your health care provider. This is important. Contact a health care provider if:  Your symptoms do not get better.  Your symptoms get worse.  You have signs of depression, such as: ? A persistently sad or irritable mood. ? Loss of enjoyment in activities that used to bring you joy. ? Change in weight or eating. ? Changes in sleeping habits. ? Avoiding friends or family members. ? Loss of energy for normal tasks. ? Feelings of guilt or worthlessness. Get help right away if:  You have serious thoughts about hurting yourself or others. If you ever feel like you may hurt yourself or others, or have thoughts about taking your own life, get help right away. Go to your nearest emergency department or:  Call your local emergency services (911 in the [place]  Call a suicide crisis helpline, such as the National Suicide Prevention Lifeline at 1-800-273-8255. This is open 24 hours a day in the [place]  Text the Crisis Text Line at 741741 (in the [place] Summary  Generalized anxiety disorder (GAD) is a mental  health condition that involves worry that is not triggered by a specific event.  People with GAD often worry excessively about many things in their lives, such as their health and family.  GAD may cause symptoms such as restlessness, trouble concentrating, sleep problems, frequent sweating, nausea, diarrhea, headaches, and trembling or muscle twitching.  A mental health specialist can help determine which treatment is best for you. Some people see improvement with one type of therapy. However, other people require a combination of therapies. This information is not intended to replace advice given to you by your health care provider. Make sure you discuss any questions you have with your health care provider. Document Revised: 10/26/2018 Document Reviewed: 10/26/2018 Elsevier Patient Education  2021 Elsevier Inc.  

## 2020-04-25 ENCOUNTER — Other Ambulatory Visit: Payer: Self-pay | Admitting: Family

## 2020-04-25 LAB — CBC WITH DIFFERENTIAL/PLATELET
Basophils Absolute: 0 10*3/uL (ref 0.0–0.2)
Basos: 1 %
EOS (ABSOLUTE): 0 10*3/uL (ref 0.0–0.4)
Eos: 0 %
Hematocrit: 43.1 % (ref 34.0–46.6)
Hemoglobin: 14.3 g/dL (ref 11.1–15.9)
Immature Grans (Abs): 0 10*3/uL (ref 0.0–0.1)
Immature Granulocytes: 0 %
Lymphocytes Absolute: 1.3 10*3/uL (ref 0.7–3.1)
Lymphs: 23 %
MCH: 31.6 pg (ref 26.6–33.0)
MCHC: 33.2 g/dL (ref 31.5–35.7)
MCV: 95 fL (ref 79–97)
Monocytes Absolute: 0.4 10*3/uL (ref 0.1–0.9)
Monocytes: 6 %
Neutrophils Absolute: 4.1 10*3/uL (ref 1.4–7.0)
Neutrophils: 70 %
Platelets: 299 10*3/uL (ref 150–450)
RBC: 4.53 x10E6/uL (ref 3.77–5.28)
RDW: 11.2 % — ABNORMAL LOW (ref 11.7–15.4)
WBC: 5.9 10*3/uL (ref 3.4–10.8)

## 2020-04-25 LAB — CMP14+EGFR
ALT: 10 IU/L (ref 0–32)
AST: 14 IU/L (ref 0–40)
Albumin/Globulin Ratio: 1.5 (ref 1.2–2.2)
Albumin: 4.5 g/dL (ref 3.8–4.8)
Alkaline Phosphatase: 52 IU/L (ref 44–121)
BUN/Creatinine Ratio: 21 (ref 9–23)
BUN: 17 mg/dL (ref 6–24)
Bilirubin Total: 0.4 mg/dL (ref 0.0–1.2)
CO2: 20 mmol/L (ref 20–29)
Calcium: 9.7 mg/dL (ref 8.7–10.2)
Chloride: 100 mmol/L (ref 96–106)
Creatinine, Ser: 0.81 mg/dL (ref 0.57–1.00)
Globulin, Total: 3.1 g/dL (ref 1.5–4.5)
Glucose: 80 mg/dL (ref 65–99)
Potassium: 5 mmol/L (ref 3.5–5.2)
Sodium: 136 mmol/L (ref 134–144)
Total Protein: 7.6 g/dL (ref 6.0–8.5)
eGFR: 92 mL/min/{1.73_m2} (ref >59)

## 2020-04-25 LAB — LIPID PANEL
Chol/HDL Ratio: 3.1 ratio (ref 0.0–4.4)
Cholesterol, Total: 239 mg/dL — ABNORMAL HIGH (ref 100–199)
HDL: 78 mg/dL (ref >39)
LDL Chol Calc (NIH): 131 mg/dL — ABNORMAL HIGH (ref 0–99)
Triglycerides: 170 mg/dL — ABNORMAL HIGH (ref 0–149)
VLDL Cholesterol Cal: 30 mg/dL (ref 5–40)

## 2020-04-25 LAB — TSH: TSH: 4.01 u[IU]/mL (ref 0.450–4.500)

## 2020-04-25 LAB — HEPATITIS C ANTIBODY: Hep C Virus Ab: <0.1 s/co ratio (ref 0.0–0.9)

## 2020-04-25 MED ORDER — LEVOTHYROXINE SODIUM 125 MCG PO TABS: 125.0000 ug | ORAL_TABLET | Freq: Every day | ORAL | 4 refills | Status: DC

## 2020-05-01 ENCOUNTER — Ambulatory Visit (HOSPITAL_COMMUNITY): Payer: 59

## 2020-05-03 ENCOUNTER — Ambulatory Visit (HOSPITAL_COMMUNITY)
Admission: RE | Admit: 2020-05-03 | Discharge: 2020-05-03 | Disposition: A | Discharge status: Home / Self Care | Payer: 59 | Source: Ambulatory Visit | Attending: Family | Admitting: Family

## 2020-05-03 DIAGNOSIS — N838 Other noninflammatory disorders of ovary, fallopian tube and broad ligament: Secondary | ICD-10-CM | POA: Insufficient documentation

## 2020-07-15 ENCOUNTER — Other Ambulatory Visit (HOSPITAL_COMMUNITY): Payer: Self-pay | Admitting: Family

## 2020-07-15 DIAGNOSIS — Z1231 Encounter for screening mammogram for malignant neoplasm of breast: Secondary | ICD-10-CM

## 2020-07-17 ENCOUNTER — Other Ambulatory Visit: Payer: Self-pay

## 2020-07-17 ENCOUNTER — Ambulatory Visit (HOSPITAL_COMMUNITY)
Admission: RE | Admit: 2020-07-17 | Discharge: 2020-07-17 | Disposition: A | Discharge status: Home / Self Care | Payer: Commercial Managed Care - PPO | Source: Ambulatory Visit | Attending: Family | Admitting: Family

## 2020-07-17 DIAGNOSIS — Z1231 Encounter for screening mammogram for malignant neoplasm of breast: Secondary | ICD-10-CM | POA: Diagnosis not present

## 2020-08-08 ENCOUNTER — Other Ambulatory Visit: Payer: Self-pay | Admitting: Family

## 2020-08-08 DIAGNOSIS — E875 Hyperkalemia: Secondary | ICD-10-CM

## 2020-08-22 ENCOUNTER — Ambulatory Visit: Payer: Self-pay | Admitting: Family Medicine

## 2020-08-28 ENCOUNTER — Ambulatory Visit (INDEPENDENT_AMBULATORY_CARE_PROVIDER_SITE_OTHER): Payer: Commercial Managed Care - PPO | Admitting: Nurse Practitioner

## 2020-08-28 ENCOUNTER — Other Ambulatory Visit: Payer: Self-pay

## 2020-08-28 ENCOUNTER — Encounter: Payer: Self-pay | Admitting: Nurse Practitioner

## 2020-08-28 VITALS — BP 137/85 | HR 72 | Temp 97.8°F | Ht 64.0 in | Wt 156.0 lb

## 2020-08-28 DIAGNOSIS — R42 Dizziness and giddiness: Secondary | ICD-10-CM | POA: Diagnosis not present

## 2020-08-28 MED ORDER — MECLIZINE HCL 12.5 MG PO TABS: 12.5000 mg | ORAL_TABLET | Freq: Three times a day (TID) | ORAL | 0 refills | Status: DC | PRN

## 2020-08-28 NOTE — Patient Instructions (Signed)
Dizziness Dizziness is a common problem. It makes you feel unsteady or light-headed. You may feel like you are about to pass out (faint). Dizziness can lead to getting hurt if you stumble or fall. Dizziness can be caused by many things, including: Medicines. Not having enough water in your body (dehydration). Illness. Follow these instructions at home: Eating and drinking  Drink enough fluid to keep your pee (urine) pale yellow. This helps to keep you from getting dehydrated. Try to drink more clear fluids, such as water. Do not drink alcohol. Limit how much caffeine you drink or eat, if your doctor tells you to do that. Limit how much salt (sodium) you drink or eat, if your doctor tells you to do that.  Activity  Avoid making quick movements. Stand up slowly from sitting in a chair, and steady yourself until you feel okay. In the morning, first sit up on the side of the bed. When you feel okay, stand up slowly while you hold onto something. Do this until you know that your balance is okay. If you need to stand in one place for a long time, move your legs often. Tighten and relax the muscles in your legs while you are standing. Do not drive or use machinery if you feel dizzy. Avoid bending down if you feel dizzy. Place items in your home so you can reach them easily without leaning over.  Lifestyle Do not smoke or use any products that contain nicotine or tobacco. If you need help quitting, ask your doctor. Try to lower your stress level. You can do this by using methods such as yoga or meditation. Talk with your doctor if you need help. General instructions Watch your dizziness for any changes. Take over-the-counter and prescription medicines only as told by your doctor. Talk with your doctor if you think that you are dizzy because of a medicine that you are taking. Tell a friend or a family member that you are feeling dizzy. If he or she notices any changes in your behavior, have this  person call your doctor. Keep all follow-up visits. Contact a doctor if: Your dizziness does not go away. Your dizziness or light-headedness gets worse. You feel like you may vomit (are nauseous). You have trouble hearing. You have new symptoms. You are unsteady on your feet. You feel like the room is spinning. You have neck pain or a stiff neck. You have a fever. Get help right away if: You vomit or have watery poop (diarrhea), and you cannot eat or drink anything. You have trouble: Talking. Walking. Swallowing. Using your arms, hands, or legs. You feel generally weak. You are not thinking clearly, or you have trouble forming sentences. A friend or family member may notice this. You have: Chest pain. Pain in your belly (abdomen). Shortness of breath. Sweating. Your vision changes. You are bleeding. You have a very bad headache. These symptoms may be an emergency. Get help right away. Call your local emergency services (911 in the [place] Do not wait to see if the symptoms will go away. Do not drive yourself to the hospital. Summary Dizziness makes you feel unsteady or light-headed. You may feel like you are about to pass out (faint). Drink enough fluid to keep your pee (urine) pale yellow. Do not drink alcohol. Avoid making quick movements if you feel dizzy. Watch your dizziness for any changes. This information is not intended to replace advice given to you by your health care provider. Make sure you discuss   any questions you have with your healthcare provider. Document Revised: 12/11/2019 Document Reviewed: 12/11/2019 Elsevier Patient Education  2022 Elsevier Inc.  

## 2020-08-28 NOTE — Assessment & Plan Note (Signed)
Patient is reporting dizziness symptoms started in the past few weeks, symptoms are intermittent and cannot be associated with any particular thing.  Advised patient to keep a log of when symptoms starts and everything associated with it.  No nausea, vomiting, vision impairment or gait disturbances.  Started patient on meclizine 12.5 mg tablet by mouth.  Follow-up with worsening unresolved symptoms.  Printed handouts given.  Rx sent to pharmacy.

## 2020-08-28 NOTE — Progress Notes (Signed)
Acute Office Visit  Subjective:    Patient ID: Michelle Delgado, female    DOB: 1975/04/13, 45 y.o.   MRN: [id number]  Chief Complaint  Patient presents with   Depression   Dizziness    Dizziness This is a recurrent problem. The current episode started more than 1 month ago. The problem occurs intermittently. The problem has been unchanged. Pertinent negatives include no abdominal pain, fatigue, rash or visual change. Nothing aggravates the symptoms. She has tried nothing for the symptoms.      Associated symptoms: No hearing loss Yes tinnitus  No chest discomfort Yes heart palpitations  No heart racing No numbness or tingling of extremities  No nausea No vomiting  No speech difficulty No visual changes    Wt Readings from Last 3 Encounters:  08/28/20 156 lb (70.8 kg)  04/24/20 151 lb 12.8 oz (68.9 kg)  11/16/19 145 lb (65.8 kg)    BP Readings from Last 3 Encounters:  08/28/20 137/85  04/24/20 122/81  11/16/19 101/67      Lab Results  Component Value Date   WBC 5.9 04/24/2020   HGB 14.3 04/24/2020   HCT 43.1 04/24/2020   MCV 95 04/24/2020   PLT 299 04/24/2020   Lab Results  Component Value Date   NA 136 04/24/2020   K 5.0 04/24/2020   CO2 20 04/24/2020   BUN 17 04/24/2020   CREATININE 0.81 04/24/2020   CALCIUM 9.7 04/24/2020   GLUCOSE 80 04/24/2020     ---------------------------------------------------------------------------------------------------   Past Medical History:  Diagnosis Date   Allergy    Anxiety    Chronic headaches    Depression    Hypothyroidism     Past Surgical History:  Procedure Laterality Date   CESAREAN SECTION  2005   LAPAROSCOPIC APPENDECTOMY N/A 11/16/2019   Procedure: APPENDECTOMY LAPAROSCOPIC;  Surgeon: Virl Cagey, MD;  Location: [facility];  Service: General;  Laterality: N/A;    Family History  Problem Relation Age of Onset   Thyroid disease Mother    Graves' disease Sister    Aneurysm Maternal Grandmother      Social History   Socioeconomic History   Marital status: Married    Spouse name: Not on file   Number of children: Not on file   Years of education: Not on file   Highest education level: Not on file  Occupational History   Not on file  Tobacco Use   Smoking status: Never   Smokeless tobacco: Never  Substance and Sexual Activity   Alcohol use: Yes    Alcohol/week: 1.0 standard drink    Types: 1 Glasses of wine per week   Drug use: No   Sexual activity: Yes    Birth control/protection: Pill  Other Topics Concern   Not on file  Social History Narrative   Not on file   Social Determinants of Health   Financial Resource Strain: Not on file  Food Insecurity: Not on file  Transportation Needs: Not on file  Physical Activity: Not on file  Stress: Not on file  Social Connections: Not on file  Intimate Partner Violence: Not on file    Outpatient Medications Prior to Visit  Medication Sig Dispense Refill   levothyroxine (SYNTHROID) 125 MCG tablet Take 1 tablet (125 mcg total) by mouth daily. 90 tablet 4   SYNTHROID 112 MCG tablet Take 1 tablet (112 mcg total) by mouth daily. (Needs to be seen before next refill) 90 tablet 1   Norgestimate-Ethinyl  Estradiol Triphasic (TRI-SPRINTEC) 0.18/0.215/0.25 MG-35 MCG tablet Take 1 tablet by mouth daily. 90 tablet 4   No facility-administered medications prior to visit.    Allergies  Allergen Reactions   Sulfa Antibiotics Rash    Review of Systems  Constitutional: Negative.  Negative for fatigue.  HENT: Negative.    Eyes: Negative.   Respiratory: Negative.    Gastrointestinal:  Negative for abdominal pain.  Skin:  Negative for rash.  Neurological:  Positive for dizziness.  All other systems reviewed and are negative.     Objective:    Physical Exam Vitals and nursing note reviewed.  Constitutional:      Appearance: Normal appearance.  HENT:     Head: Normocephalic.     Nose: Nose normal.  Eyes:      Conjunctiva/sclera: Conjunctivae normal.  Cardiovascular:     Rate and Rhythm: Normal rate and regular rhythm.  Pulmonary:     Effort: Pulmonary effort is normal.     Breath sounds: Normal breath sounds.  Abdominal:     General: Bowel sounds are normal.  Skin:    Findings: No rash.  Neurological:     Mental Status: She is alert and oriented to person, place, and time.     Cranial Nerves: Cranial nerves are intact.     Motor: Motor function is intact.     Coordination: Coordination is intact.     Gait: Gait is intact.  Psychiatric:        Attention and Perception: Attention normal.        Mood and Affect: Mood is anxious.    BP 137/85   Pulse 72   Temp 97.8 F (36.6 C) (Temporal)   Ht '5\' 4"'  (1.626 m)   Wt 156 lb (70.8 kg)   SpO2 98%   BMI 26.78 kg/m  Wt Readings from Last 3 Encounters:  08/28/20 156 lb (70.8 kg)  04/24/20 151 lb 12.8 oz (68.9 kg)  11/16/19 145 lb (65.8 kg)    Health Maintenance Due  Topic Date Due   Pneumococcal Vaccine 46-33 Years old (1 - PCV) Never done   INFLUENZA VACCINE  08/19/2020    There are no preventive care reminders to display for this patient.   Lab Results  Component Value Date   TSH 4.010 04/24/2020   Lab Results  Component Value Date   WBC 5.9 04/24/2020   HGB 14.3 04/24/2020   HCT 43.1 04/24/2020   MCV 95 04/24/2020   PLT 299 04/24/2020   Lab Results  Component Value Date   NA 136 04/24/2020   K 5.0 04/24/2020   CO2 20 04/24/2020   GLUCOSE 80 04/24/2020   BUN 17 04/24/2020   CREATININE 0.81 04/24/2020   BILITOT 0.4 04/24/2020   ALKPHOS 52 04/24/2020   AST 14 04/24/2020   ALT 10 04/24/2020   PROT 7.6 04/24/2020   ALBUMIN 4.5 04/24/2020   CALCIUM 9.7 04/24/2020   ANIONGAP 11 11/15/2019   EGFR 92 04/24/2020   Lab Results  Component Value Date   CHOL 239 (H) 04/24/2020   Lab Results  Component Value Date   HDL 78 04/24/2020   Lab Results  Component Value Date   LDLCALC 131 (H) 04/24/2020   Lab Results   Component Value Date   TRIG 170 (H) 04/24/2020   Lab Results  Component Value Date   CHOLHDL 3.1 04/24/2020   No results found for: HGBA1C     Assessment & Plan:   Problem List Items Addressed  This Visit       Other   Dizziness - Primary    Patient is reporting dizziness symptoms started in the past few weeks, symptoms are intermittent and cannot be associated with any particular thing.  Advised patient to keep a log of when symptoms starts and everything associated with it.  No nausea, vomiting, vision impairment or gait disturbances.  Started patient on meclizine 12.5 mg tablet by mouth.  Follow-up with worsening unresolved symptoms.  Printed handouts given.  Rx sent to pharmacy.       Relevant Medications   meclizine (ANTIVERT) 12.5 MG tablet     Meds ordered this encounter  Medications   meclizine (ANTIVERT) 12.5 MG tablet    Sig: Take 1 tablet (12.5 mg total) by mouth 3 (three) times daily as needed for dizziness.    Dispense:  30 tablet    Refill:  0    Order Specific Question:   Supervising Provider    Answer:   Janora Norlander [[id number]]     Ivy Lynn, NP

## 2020-09-02 ENCOUNTER — Other Ambulatory Visit: Payer: Self-pay | Admitting: Family Medicine

## 2020-09-02 ENCOUNTER — Other Ambulatory Visit: Payer: Self-pay | Admitting: Family

## 2020-09-02 MED ORDER — SYNTHROID 112 MCG PO TABS: 112.0000 ug | ORAL_TABLET | Freq: Every day | ORAL | 2 refills | Status: DC

## 2020-09-17 ENCOUNTER — Ambulatory Visit: Payer: Commercial Managed Care - PPO | Admitting: Family

## 2020-09-17 ENCOUNTER — Ambulatory Visit: Payer: Commercial Managed Care - PPO | Admitting: Family Medicine

## 2020-10-11 ENCOUNTER — Other Ambulatory Visit: Payer: Self-pay | Admitting: Family

## 2020-10-11 DIAGNOSIS — R42 Dizziness and giddiness: Secondary | ICD-10-CM

## 2020-10-11 DIAGNOSIS — W57XXXA Bitten or stung by nonvenomous insect and other nonvenomous arthropods, initial encounter: Secondary | ICD-10-CM

## 2020-10-14 ENCOUNTER — Other Ambulatory Visit: Payer: Commercial Managed Care - PPO

## 2020-10-14 ENCOUNTER — Other Ambulatory Visit: Payer: Self-pay

## 2020-10-14 DIAGNOSIS — W57XXXA Bitten or stung by nonvenomous insect and other nonvenomous arthropods, initial encounter: Secondary | ICD-10-CM

## 2020-10-14 DIAGNOSIS — R42 Dizziness and giddiness: Secondary | ICD-10-CM

## 2020-10-16 LAB — CMP14+EGFR
ALT: 13 IU/L (ref 0–32)
AST: 15 IU/L (ref 0–40)
Albumin/Globulin Ratio: 2 (ref 1.2–2.2)
Albumin: 4.9 g/dL — ABNORMAL HIGH (ref 3.8–4.8)
Alkaline Phosphatase: 69 IU/L (ref 44–121)
BUN/Creatinine Ratio: 21 (ref 9–23)
BUN: 17 mg/dL (ref 6–24)
Bilirubin Total: 0.3 mg/dL (ref 0.0–1.2)
CO2: 20 mmol/L (ref 20–29)
Calcium: 9.8 mg/dL (ref 8.7–10.2)
Chloride: 101 mmol/L (ref 96–106)
Creatinine, Ser: 0.81 mg/dL (ref 0.57–1.00)
Globulin, Total: 2.5 g/dL (ref 1.5–4.5)
Glucose: 81 mg/dL (ref 70–99)
Potassium: 4.5 mmol/L (ref 3.5–5.2)
Sodium: 142 mmol/L (ref 134–144)
Total Protein: 7.4 g/dL (ref 6.0–8.5)
eGFR: 92 mL/min/{1.73_m2} (ref >59)

## 2020-10-16 LAB — ANEMIA PROFILE B
Basophils Absolute: 0 10*3/uL (ref 0.0–0.2)
Basos: 1 %
EOS (ABSOLUTE): 0.1 10*3/uL (ref 0.0–0.4)
Eos: 1 %
Ferritin: 62 ng/mL (ref 15–150)
Folate: 10.1 ng/mL (ref >3.0)
Hematocrit: 42.6 % (ref 34.0–46.6)
Hemoglobin: 14.6 g/dL (ref 11.1–15.9)
Immature Grans (Abs): 0 10*3/uL (ref 0.0–0.1)
Immature Granulocytes: 0 %
Iron Saturation: 24 % (ref 15–55)
Iron: 86 ug/dL (ref 27–159)
Lymphocytes Absolute: 2 10*3/uL (ref 0.7–3.1)
Lymphs: 35 %
MCH: 32.2 pg (ref 26.6–33.0)
MCHC: 34.3 g/dL (ref 31.5–35.7)
MCV: 94 fL (ref 79–97)
Monocytes Absolute: 0.4 10*3/uL (ref 0.1–0.9)
Monocytes: 6 %
Neutrophils Absolute: 3.3 10*3/uL (ref 1.4–7.0)
Neutrophils: 57 %
Platelets: 280 10*3/uL (ref 150–450)
RBC: 4.54 x10E6/uL (ref 3.77–5.28)
RDW: 11.2 % — ABNORMAL LOW (ref 11.7–15.4)
Retic Ct Pct: 1.5 % (ref 0.6–2.6)
Total Iron Binding Capacity: 357 ug/dL (ref 250–450)
UIBC: 271 ug/dL (ref 131–425)
Vitamin B-12: 998 pg/mL (ref 232–1245)
WBC: 5.7 10*3/uL (ref 3.4–10.8)

## 2020-10-16 LAB — TSH: TSH: 0.318 u[IU]/mL — ABNORMAL LOW (ref 0.450–4.500)

## 2020-10-16 LAB — ROCKY MTN SPOTTED FVR ABS PNL(IGG+IGM)
RMSF IgG: NEGATIVE
RMSF IgM: 0.24 index (ref 0.00–0.89)

## 2020-10-16 LAB — LYME DISEASE SEROLOGY W/REFLEX: Lyme Total Antibody EIA: NEGATIVE

## 2020-10-17 ENCOUNTER — Other Ambulatory Visit: Payer: Self-pay | Admitting: Family

## 2020-10-17 MED ORDER — LEVOTHYROXINE SODIUM 100 MCG PO TABS: 100.0000 ug | ORAL_TABLET | Freq: Every day | ORAL | 2 refills | Status: DC

## 2020-10-21 ENCOUNTER — Ambulatory Visit (INDEPENDENT_AMBULATORY_CARE_PROVIDER_SITE_OTHER): Payer: Commercial Managed Care - PPO | Admitting: Family

## 2020-10-21 ENCOUNTER — Encounter: Payer: Self-pay | Admitting: Family

## 2020-10-21 ENCOUNTER — Other Ambulatory Visit: Payer: Self-pay

## 2020-10-21 VITALS — BP 122/86 | HR 77 | Temp 97.6°F | Ht 64.0 in | Wt 156.4 lb

## 2020-10-21 DIAGNOSIS — M542 Cervicalgia: Secondary | ICD-10-CM

## 2020-10-21 DIAGNOSIS — Z23 Encounter for immunization: Secondary | ICD-10-CM | POA: Diagnosis not present

## 2020-10-21 DIAGNOSIS — R42 Dizziness and giddiness: Secondary | ICD-10-CM | POA: Diagnosis not present

## 2020-10-21 DIAGNOSIS — E039 Hypothyroidism, unspecified: Secondary | ICD-10-CM | POA: Diagnosis not present

## 2020-10-21 DIAGNOSIS — F411 Generalized anxiety disorder: Secondary | ICD-10-CM

## 2020-10-21 MED ORDER — BUSPIRONE HCL 5 MG PO TABS: 5.0000 mg | ORAL_TABLET | Freq: Three times a day (TID) | ORAL | 2 refills | Status: DC | PRN

## 2020-10-21 MED ORDER — BACLOFEN 10 MG PO TABS: 10.0000 mg | ORAL_TABLET | Freq: Three times a day (TID) | ORAL | 0 refills | Status: DC | PRN

## 2020-10-21 NOTE — Patient Instructions (Signed)
Generalized Anxiety Disorder, Adult Generalized anxiety disorder (GAD) is a mental health condition. Unlike normal worries, anxiety related to GAD is not triggered by a specific event. These worries do not fade or get better with time. GAD interferes with relationships, work, and school. GAD symptoms can vary from mild to severe. People with severe GAD can have intense waves of anxiety with physical symptoms that are similar to panic attacks. What are the causes? The exact cause of GAD is not known, but the following are believed to have an impact: Differences in natural brain chemicals. Genes passed down from parents to children. Differences in the way threats are perceived. Development during childhood. Personality. What increases the risk? The following factors may make you more likely to develop this condition: Being female. Having a family history of anxiety disorders. Being very shy. Experiencing very stressful life events, such as the death of a loved one. Having a very stressful family environment. What are the signs or symptoms? People with GAD often worry excessively about many things in their lives, such as their health and family. Symptoms may also include: Mental and emotional symptoms: Worrying excessively about natural disasters. Fear of being late. Difficulty concentrating. Fears that others are judging your performance. Physical symptoms: Fatigue. Headaches, muscle tension, muscle twitches, trembling, or feeling shaky. Feeling like your heart is pounding or beating very fast. Feeling out of breath or like you cannot take a deep breath. Having trouble falling asleep or staying asleep, or experiencing restlessness. Sweating. Nausea, diarrhea, or irritable bowel syndrome (IBS). Behavioral symptoms: Experiencing erratic moods or irritability. Avoidance of new situations. Avoidance of people. Extreme difficulty making decisions. How is this diagnosed? This condition  is diagnosed based on your symptoms and medical history. You will also have a physical exam. Your health care provider may perform tests to rule out other possible causes of your symptoms. To be diagnosed with GAD, a person must have anxiety that: Is out of his or her control. Affects several different aspects of his or her life, such as work and relationships. Causes distress that makes him or her unable to take part in normal activities. Includes at least three symptoms of GAD, such as restlessness, fatigue, trouble concentrating, irritability, muscle tension, or sleep problems. Before your health care provider can confirm a diagnosis of GAD, these symptoms must be present more days than they are not, and they must last for 6 months or longer. How is this treated? This condition may be treated with: Medicine. Antidepressant medicine is usually prescribed for long-term daily control. Anti-anxiety medicines may be added in severe cases, especially when panic attacks occur. Talk therapy (psychotherapy). Certain types of talk therapy can be helpful in treating GAD by providing support, education, and guidance. Options include: Cognitive behavioral therapy (CBT). People learn coping skills and self-calming techniques to ease their physical symptoms. They learn to identify unrealistic thoughts and behaviors and to replace them with more appropriate thoughts and behaviors. Acceptance and commitment therapy (ACT). This treatment teaches people how to be mindful as a way to cope with unwanted thoughts and feelings. Biofeedback. This process trains you to manage your body's response (physiological response) through breathing techniques and relaxation methods. You will work with a therapist while machines are used to monitor your physical symptoms. Stress management techniques. These include yoga, meditation, and exercise. A mental health specialist can help determine which treatment is best for you. Some  people see improvement with one type of therapy. However, other people require a combination   of therapies. Follow these instructions at home: Lifestyle Maintain a consistent routine and schedule. Anticipate stressful situations. Create a plan, and allow extra time to work with your plan. Practice stress management or self-calming techniques that you have learned from your therapist or your health care provider. General instructions Take over-the-counter and prescription medicines only as told by your health care provider. Understand that you are likely to have setbacks. Accept this and be kind to yourself as you persist to take better care of yourself. Recognize and accept your accomplishments, even if you judge them as small. Keep all follow-up visits as told by your health care provider. This is important. Contact a health care provider if: Your symptoms do not get better. Your symptoms get worse. You have signs of depression, such as: A persistently sad or irritable mood. Loss of enjoyment in activities that used to bring you joy. Change in weight or eating. Changes in sleeping habits. Avoiding friends or family members. Loss of energy for normal tasks. Feelings of guilt or worthlessness. Get help right away if: You have serious thoughts about hurting yourself or others. If you ever feel like you may hurt yourself or others, or have thoughts about taking your own life, get help right away. Go to your nearest emergency department or: Call your local emergency services (911 in the [place] Call a suicide crisis helpline, such as the National Suicide Prevention Lifeline at 1-800-273-8255. This is open 24 hours a day in the [place] Text the Crisis Text Line at 741741 (in the [place] Summary Generalized anxiety disorder (GAD) is a mental health condition that involves worry that is not triggered by a specific event. People with GAD often worry excessively about many things in their lives, such  as their health and family. GAD may cause symptoms such as restlessness, trouble concentrating, sleep problems, frequent sweating, nausea, diarrhea, headaches, and trembling or muscle twitching. A mental health specialist can help determine which treatment is best for you. Some people see improvement with one type of therapy. However, other people require a combination of therapies. This information is not intended to replace advice given to you by your health care provider. Make sure you discuss any questions you have with your health care provider. Document Revised: 10/26/2018 Document Reviewed: 10/26/2018 Elsevier Patient Education  2022 [employer] Inc[employer]  

## 2020-10-21 NOTE — Progress Notes (Signed)
Subjective:    Patient ID: Michelle Delgado, female    DOB: Mar 06, 1975, 45 y.o.   MRN: [id number]  Chief Complaint  Patient presents with   Dizziness    For 3 mths   Neck Pain    For 3 mths    PT presents to the office today with dizziness and neck pain. She was seen on 08/28/20 for the dizziness and was given Antivert 12.5 mg. She reports this has helped.   She had lab work drawn prior to this visit and the TSH was abnormal her levothyroxine was decreased. We also checked her for Lyme and RMSF because of tick bites this year.  Dizziness The current episode started more than 1 month ago. The problem occurs intermittently. The problem has been waxing and waning. Associated symptoms include fatigue and neck pain. The symptoms are aggravated by walking. She has tried rest for the symptoms. The treatment provided mild relief.  Neck Pain  This is a chronic problem. The current episode started more than 1 month ago. The problem occurs intermittently. The quality of the pain is described as aching. The pain is at a severity of 5/10. The pain is moderate.  Thyroid Problem Presents for follow-up visit. Symptoms include anxiety and fatigue. Patient reports no dry skin or heat intolerance. The symptoms have been stable.  Anxiety Presents for follow-up visit. Symptoms include dizziness, irritability, nervous/anxious behavior and restlessness.       Review of Systems  Constitutional:  Positive for fatigue and irritability.  Endocrine: Negative for heat intolerance.  Musculoskeletal:  Positive for neck pain.  Neurological:  Positive for dizziness.  Psychiatric/Behavioral:  The patient is nervous/anxious.   All other systems reviewed and are negative.     Objective:   Physical Exam Vitals reviewed.  Constitutional:      General: She is not in acute distress.    Appearance: She is well-developed.  HENT:     Head: Normocephalic and atraumatic.  Eyes:     Pupils: Pupils are equal, round,  and reactive to light.  Neck:     Thyroid: No thyromegaly.     Comments: Posterior neck pain with flexion Cardiovascular:     Rate and Rhythm: Normal rate and regular rhythm.     Heart sounds: Normal heart sounds. No murmur heard. Pulmonary:     Effort: Pulmonary effort is normal. No respiratory distress.     Breath sounds: Normal breath sounds. No wheezing.  Abdominal:     General: Bowel sounds are normal. There is no distension.     Palpations: Abdomen is soft.     Tenderness: There is no abdominal tenderness.  Musculoskeletal:        General: No tenderness. Normal range of motion.     Cervical back: Normal range of motion and neck supple.  Skin:    General: Skin is warm and dry.  Neurological:     Mental Status: She is alert and oriented to person, place, and time.     Cranial Nerves: No cranial nerve deficit.     Deep Tendon Reflexes: Reflexes are normal and symmetric.  Psychiatric:        Behavior: Behavior normal.        Thought Content: Thought content normal.        Judgment: Judgment normal.      BP 122/86   Pulse 77   Temp 97.6 F (36.4 C) (Temporal)   Ht 5\' 4"  (1.626 m)   Wt 156 lb  6.4 oz (70.9 kg)   LMP 10/14/2020   BMI 26.85 kg/m      Assessment & Plan:  MITZI LILJA comes in today with chief complaint of Dizziness (For 3 mths) and Neck Pain (For 3 mths )   Diagnosis and orders addressed:  1. Need for immunization against influenza - Flu Vaccine QUAD 7mo+IM (Fluarix, Fluzone & Alfiuria Quad PF)  2. Hypothyroidism, unspecified type Continue Levothyroxine dose  3. Dizziness Continue Antivert 12.5 mg as needed Epley exercises encouraged   4. Neck pain Rest ROM exercises encouraged  - baclofen (LIORESAL) 10 MG tablet; Take 1 tablet (10 mg total) by mouth 3 (three) times daily as needed for muscle spasms.  Dispense: 30 each; Refill: 0  5. GAD (generalized anxiety disorder) Stress management - busPIRone (BUSPAR) 5 MG tablet; Take 1 tablet (5  mg total) by mouth 3 (three) times daily as needed.  Dispense: 60 tablet; Refill: 2   Labs pending Health Maintenance reviewed Diet and exercise encouraged  Follow up plan: 2 months    Jannifer Rodney, FNP

## 2020-11-20 DIAGNOSIS — M545 Low back pain, unspecified: Secondary | ICD-10-CM | POA: Insufficient documentation

## 2020-12-23 ENCOUNTER — Other Ambulatory Visit: Payer: Self-pay | Admitting: Family

## 2020-12-23 MED ORDER — LEVOTHYROXINE SODIUM 112 MCG PO TABS: 112.0000 ug | ORAL_TABLET | Freq: Every day | ORAL | 1 refills | Status: DC

## 2021-04-16 ENCOUNTER — Encounter: Payer: Self-pay | Admitting: Nurse Practitioner

## 2021-04-16 ENCOUNTER — Ambulatory Visit (INDEPENDENT_AMBULATORY_CARE_PROVIDER_SITE_OTHER): Payer: Commercial Managed Care - PPO | Admitting: Nurse Practitioner

## 2021-04-16 DIAGNOSIS — U071 COVID-19: Secondary | ICD-10-CM | POA: Diagnosis not present

## 2021-04-16 DIAGNOSIS — R051 Acute cough: Secondary | ICD-10-CM | POA: Diagnosis not present

## 2021-04-16 MED ORDER — MOLNUPIRAVIR EUA 200MG CAPSULE: 4.0000 | ORAL_CAPSULE | Freq: Two times a day (BID) | ORAL | 0 refills | Status: AC

## 2021-04-16 MED ORDER — BENZONATATE 100 MG PO CAPS
100.0000 mg | ORAL_CAPSULE | Freq: Three times a day (TID) | ORAL | 0 refills | Status: DC | PRN
Start: 2021-04-16 — End: 2021-05-27

## 2021-04-16 NOTE — Progress Notes (Signed)
? ?  Virtual Visit  Note ?Due to COVID-19 pandemic this visit was conducted virtually. This visit type was conducted due to national recommendations for restrictions regarding the COVID-19 Pandemic (e.g. social distancing, sheltering in place) in an effort to limit this patient's exposure and mitigate transmission in our community. All issues noted in this document were discussed and addressed.  A physical exam was not performed with this format. ? ?I connected with Michelle Delgado on 04/16/21 at 04:50 pm  by telephone and verified that I am speaking with the correct person using two identifiers. Michelle Delgado is currently located at home during visit. The provider, Daryll Drown, NP is located in their office at time of visit. ? ?I discussed the limitations, risks, security and privacy concerns of performing an evaluation and management service by telephone and the availability of in person appointments. I also discussed with the patient that there may be a patient responsible charge related to this service. The patient expressed understanding and agreed to proceed. ? ? ?History and Present Illness: ? ?URI  ?This is a new problem. The current episode started yesterday. The problem has been gradually worsening. There has been no fever. Associated symptoms include congestion and coughing. Pertinent negatives include no abdominal pain, chest pain, headaches, rash or sore throat. She has tried nothing for the symptoms.  ?Cough ?This is a new problem. The current episode started yesterday. The problem has been gradually worsening. The cough is Non-productive. Associated symptoms include chills and nasal congestion. Pertinent negatives include no chest pain, headaches, rash, sore throat or shortness of breath.  ? ? ? ?Review of Systems  ?Constitutional:  Positive for chills.  ?HENT:  Positive for congestion. Negative for sore throat.   ?Eyes: Negative.   ?Respiratory:  Positive for cough. Negative for shortness of  breath.   ?Cardiovascular:  Negative for chest pain.  ?Gastrointestinal:  Negative for abdominal pain.  ?Skin:  Negative for rash.  ?Neurological:  Negative for headaches.  ?All other systems reviewed and are negative. ? ? ?Observations/Objective: ?Televisit patient not in distress. ? ?Assessment and Plan: ?-Patient positive for COVID-19 ?Take meds as prescribed ?- Use a cool mist humidifier  ?-Use saline nose sprays frequently ?-Force fluids ?-For fever or aches or pains- take Tylenol or ibuprofen. ?-Molnupiravir Rx sent to pharmacy, education provided to patient. ?Follow up with worsening unresolved symptoms  ? ?Follow Up Instructions: ?Televisit patient not in distress ? ?  ?I discussed the assessment and treatment plan with the patient. The patient was provided an opportunity to ask questions and all were answered. The patient agreed with the plan and demonstrated an understanding of the instructions. ?  ?The patient was advised to call back or seek an in-person evaluation if the symptoms worsen or if the condition fails to improve as anticipated. ? ?The above assessment and management plan was discussed with the patient. The patient verbalized understanding of and has agreed to the management plan. Patient is aware to call the clinic if symptoms persist or worsen. Patient is aware when to return to the clinic for a follow-up visit. Patient educated on when it is appropriate to go to the emergency department.  ? ?Time call ended:  05:07 pm  ? ?I provided 7 minutes of  non face-to-face time during this encounter. ? ? ? ?Daryll Drown, NP ?  ?

## 2021-05-13 ENCOUNTER — Ambulatory Visit: Payer: Commercial Managed Care - PPO | Admitting: Family

## 2021-05-27 ENCOUNTER — Other Ambulatory Visit: Payer: Self-pay | Admitting: Family

## 2021-05-27 ENCOUNTER — Ambulatory Visit (INDEPENDENT_AMBULATORY_CARE_PROVIDER_SITE_OTHER): Payer: Commercial Managed Care - PPO | Admitting: Family

## 2021-05-27 ENCOUNTER — Encounter: Payer: Self-pay | Admitting: Family

## 2021-05-27 VITALS — BP 120/80 | HR 59 | Ht 64.0 in | Wt 155.0 lb

## 2021-05-27 DIAGNOSIS — F39 Unspecified mood [affective] disorder: Secondary | ICD-10-CM | POA: Diagnosis not present

## 2021-05-27 DIAGNOSIS — F410 Panic disorder [episodic paroxysmal anxiety] without agoraphobia: Secondary | ICD-10-CM

## 2021-05-27 DIAGNOSIS — E039 Hypothyroidism, unspecified: Secondary | ICD-10-CM | POA: Diagnosis not present

## 2021-05-27 DIAGNOSIS — F320 Major depressive disorder, single episode, mild: Secondary | ICD-10-CM

## 2021-05-27 DIAGNOSIS — F40243 Fear of flying: Secondary | ICD-10-CM

## 2021-05-27 DIAGNOSIS — F411 Generalized anxiety disorder: Secondary | ICD-10-CM

## 2021-05-27 DIAGNOSIS — N946 Dysmenorrhea, unspecified: Secondary | ICD-10-CM

## 2021-05-27 MED ORDER — BUSPIRONE HCL 5 MG PO TABS: 5.0000 mg | ORAL_TABLET | Freq: Three times a day (TID) | ORAL | 2 refills | Status: DC | PRN

## 2021-05-27 MED ORDER — NORETHIN ACE-ETH ESTRAD-FE 1-20 MG-MCG PO TABS: 1.0000 | ORAL_TABLET | Freq: Every day | ORAL | 3 refills | Status: DC

## 2021-05-27 MED ORDER — LEVOTHYROXINE SODIUM 112 MCG PO TABS: 112.0000 ug | ORAL_TABLET | Freq: Every day | ORAL | 2 refills | Status: DC

## 2021-05-27 MED ORDER — ALPRAZOLAM 0.25 MG PO TABS: 0.2500 mg | ORAL_TABLET | Freq: Two times a day (BID) | ORAL | 0 refills | Status: DC | PRN

## 2021-05-27 NOTE — Progress Notes (Addendum)
? ?Subjective:  ? ? Patient ID: Michelle Delgado, female    DOB: 08-29-1975, 46 y.o.   MRN: [id number] ? ?Chief Complaint  ?Patient presents with  ? Medical Management of Chronic Issues  ? Hypothyroidism  ? Vaginal Bleeding  ?  Discuss OCP  to help  ? ? ?PT presents to the office today for chronic follow up. She is complaining of heavy menstrual cycles after stopping her OC. She went to her NP at work was restarted on LoFe for 3 months. States this has been working well and would like to continue these. She does not smoker.  ? ?She had lab work drawn last month.  ? ?She reports she is flying next month and has flight anxiety. Requesting #2-3 xanax to get her on and off the plane.  ?Vaginal Bleeding ?Pertinent negatives include no diarrhea.  ?Thyroid Problem ?Presents for follow-up visit. Patient reports no anxiety, diarrhea, fatigue or hair loss.  ?Anxiety ?Presents for follow-up visit. Symptoms include excessive worry, irritability and restlessness. Patient reports no nervous/anxious behavior. Symptoms occur occasionally. The severity of symptoms is moderate.  ? ? ?Depression ?       This is a chronic problem.  The current episode started more than 1 year ago.   The onset quality is gradual.   The problem occurs intermittently.  Associated symptoms include irritable and restlessness.  Associated symptoms include no fatigue, no helplessness, no hopelessness and not sad.  Past medical history includes thyroid problem and anxiety.   ? ? ? ?Review of Systems  ?Constitutional:  Positive for irritability. Negative for fatigue.  ?Gastrointestinal:  Negative for diarrhea.  ?Genitourinary:  Positive for vaginal bleeding.  ?Psychiatric/Behavioral:  Positive for depression. The patient is not nervous/anxious.   ?All other systems reviewed and are negative. ? ?   ?Objective:  ? Physical Exam ?Vitals reviewed.  ?Constitutional:   ?   General: She is irritable. She is not in acute distress. ?   Appearance: She is well-developed.   ?HENT:  ?   Head: Normocephalic and atraumatic.  ?   Right Ear: Tympanic membrane normal.  ?   Left Ear: Tympanic membrane normal.  ?Eyes:  ?   Pupils: Pupils are equal, round, and reactive to light.  ?Neck:  ?   Thyroid: No thyromegaly.  ?Cardiovascular:  ?   Rate and Rhythm: Normal rate and regular rhythm.  ?   Heart sounds: Normal heart sounds. No murmur heard. ?Pulmonary:  ?   Effort: Pulmonary effort is normal. No respiratory distress.  ?   Breath sounds: Normal breath sounds. No wheezing.  ?Abdominal:  ?   General: Bowel sounds are normal. There is no distension.  ?   Palpations: Abdomen is soft.  ?   Tenderness: There is no abdominal tenderness.  ?Musculoskeletal:     ?   General: No tenderness. Normal range of motion.  ?   Cervical back: Normal range of motion and neck supple.  ?Skin: ?   General: Skin is warm and dry.  ?Neurological:  ?   Mental Status: She is alert and oriented to person, place, and time.  ?   Cranial Nerves: No cranial nerve deficit.  ?   Deep Tendon Reflexes: Reflexes are normal and symmetric.  ?Psychiatric:     ?   Behavior: Behavior normal.     ?   Thought Content: Thought content normal.     ?   Judgment: Judgment normal.  ? ? ? ? ?BP 120/80  Pulse (!) 59   Ht 5\' 4"  (1.626 m)   Wt 155 lb (70.3 kg)   SpO2 98%   BMI 26.61 kg/m?  ? ?   ?Assessment & Plan:  ?Michelle Delgado comes in today with chief complaint of Medical Management of Chronic Issues, Hypothyroidism, and Vaginal Bleeding (Discuss OCP  to help) ? ? ?Diagnosis and orders addressed: ? ?1. Hypothyroidism, unspecified type ?- levothyroxine (SYNTHROID) 112 MCG tablet; Take 1 tablet (112 mcg total) by mouth daily.  Dispense: 90 tablet; Refill: 2 ? ?2. Panic attack ?Continue Buspar 5 mg TID PRN ?Stress management  ?- busPIRone (BUSPAR) 5 MG tablet; Take 1 tablet (5 mg total) by mouth 3 (three) times daily as needed.  Dispense: 60 tablet; Refill: 2 ?- ALPRAZolam (XANAX) 0.25 MG tablet; Take 1 tablet (0.25 mg total) by mouth  2 (two) times daily as needed for anxiety.  Dispense: 5 tablet; Refill: 0 ? ?3. Mood disorder (HCC) ? ?4. GAD (generalized anxiety disorder) ?- busPIRone (BUSPAR) 5 MG tablet; Take 1 tablet (5 mg total) by mouth 3 (three) times daily as needed.  Dispense: 60 tablet; Refill: 2 ? ?5. Depression, major, single episode, mild (HCC) ? ?6. Dysmenorrhea ?Continue OC ?Avoid smoking  ?- norethindrone-ethinyl estradiol-FE (LOESTRIN FE) 1-20 MG-MCG tablet; Take 1 tablet by mouth daily.  Dispense: 90 tablet; Refill: 3 ? ?7. Anxiety with flying ?Will give xanax #5  ?- busPIRone (BUSPAR) 5 MG tablet; Take 1 tablet (5 mg total) by mouth 3 (three) times daily as needed.  Dispense: 60 tablet; Refill: 2 ?- ALPRAZolam (XANAX) 0.25 MG tablet; Take 1 tablet (0.25 mg total) by mouth 2 (two) times daily as needed for anxiety.  Dispense: 5 tablet; Refill: 0 ? ? ?Labs pending ?Health Maintenance reviewed ?Diet and exercise encouraged ? ?Follow up plan: ?6 months  ? ? ?Blythe Stanford, FNP ? ? ? ?

## 2021-05-27 NOTE — Patient Instructions (Signed)
Panic Attack ?A panic attack is a sudden episode of severe anxiety, fear, or discomfort that causes physical and emotional symptoms. A panic attack may be in response to something frightening, or it may occur for no known reason. ?Symptoms of a panic attack can be similar to symptoms of a heart attack or stroke. It is important to see your health care provider when you have a panic attack so that these conditions can be ruled out. ?What are the causes? ?A panic attack may be caused by: ?An extreme, life-threatening situation, such as a war or natural disaster. ?An anxiety disorder, such as post-traumatic stress disorder. ?Depression. ?Panic disorder. ?Certain medical conditions, including heart problems, neurological conditions, and infections. ?Other causes may include: ?Certain over-the-counter and prescription medicines. ?Supplements that increase anxiety. ?Illegal drugs that increase heart rate and blood pressure, such as methamphetamine. ?What increases the risk? ?You are more likely to develop this condition if: ?You have another mental health condition. ?You use alcohol, illegal drugs, or other substances. ?You are under extreme stress. ?A life event is causing increased feelings of anxiety and depression. ?What are the signs or symptoms? ?A panic attack starts suddenly, usually lasts 5-10 minutes, and occurs with one or more of the following: ?A pounding heart, or a feeling that your heart is beating irregularly or faster than normal (palpitations). ?Sweating, trembling, or shaking. ?Shortness of breath, feeling smothered, or feeling choked. ?Chest pain or discomfort. ?Nausea or a strange feeling in your stomach. ?Dizziness, feeling light-headed, or feeling like you might faint. ?Other symptoms may include: ?Chills or hot flashes. ?Numbness or tingling in your lips, hands, or feet. ?Feeling confused, or feeling that you are not yourself. ?Fear of losing control or of being emotionally unstable, or fear of  dying. ?How is this diagnosed? ?A panic attack is diagnosed with an assessment by your health care provider. During the assessment, your health care provider will ask questions about: ?Your history of anxiety, depression, and panic attacks. ?Your medical history. ?Whether you drink alcohol, use drugs, take supplements, or take medicines. Be honest about your substance use. ?Your health care provider may also: ?Order blood tests or other kinds of tests to rule out serious medical conditions. ?Refer you to a mental health professional for further evaluation. ?How is this treated? ?A panic attack is a symptom of another condition. Treatment depends on the cause of the panic attack. ?If the cause is a medical problem, your health care provider will treat that problem or refer you to a specialist. ?If the cause is emotional, you may be given anti-anxiety medicines or referred to a counselor. Anti-anxiety medicines may reduce how often attacks happen, reduce how severe the attacks are, and lower anxiety. ?If the cause is a medicine, your health care provider may tell you to stop the medicine, change your dose, or take a different medicine. ?If the cause is an illegal drug, treatment may involve letting the drug wear off and taking medicine to help the drug leave your body or to stop its effects. Attacks caused by heavy drug use may continue even if you stop using the drug. ?Most panic attacks go away with treatment of the underlying problem. If you have panic attacks often, you may have a condition called panic disorder. ?Follow these instructions at home: ?Alcohol use ?Do not drink alcohol if: ?Your health care provider tells you not to drink. ?You are pregnant, may be pregnant, or are planning to become pregnant. ?If you drink alcohol: ?Limit how much   you have to: ?0-1 drink a day for women. ?0-2 drinks a day for men. ?Know how much alcohol is in your drink. In the [place] one drink equals one 12 oz bottle of beer (355  mL), one 5 oz glass of wine (148 mL), or one 1? oz glass of hard liquor (44 mL). ?General instructions ?Take over-the-counter and prescription medicines only as told by your health care provider. ?If you feel anxious, limit your caffeine intake. ?Take good care of your physical and mental health by: ?Eating a balanced diet that includes plenty of fresh fruits and vegetables, whole grains, lean meats, and low-fat dairy. ?Getting plenty of rest. Try to get 7-8 hours of uninterrupted sleep each night. ?Exercising regularly. Try to get 30 minutes of physical activity at least 5 days a week. ?Do not use any products that contain nicotine or tobacco. These products include cigarettes, chewing tobacco, and vaping devices, such as e-cigarettes. If you need help quitting, ask your health care provider. ?Keep all follow-up visits. This is important. Panic attacks may have underlying physical or emotional problems that take time to accurately diagnose. ?Where to find more information ?Substance Abuse and Mental Health Services Administration (SAMHSA): samhsa.gov ?National Institute of Mental Health (NIMH): www.nimh.nih.gov ?Contact a health care provider if: ?Your symptoms do not improve, or they get worse. ?You are not able to take your medicine as prescribed because of side effects. ?Get help right away if: ?You have thoughts about hurting yourself or others. ?Get help right away if you feel like you may hurt yourself or others, or have thoughts about taking your own life. Go to your nearest emergency room or: ?Call 911. ?Call the National Suicide Prevention Lifeline at 1-800-273-8255 or 988. This is open 24 hours a day. ?Text the Crisis Text Line at 741741. ?Summary ?A panic attack is a sudden episode of severe anxiety, fear, or discomfort that causes physical and emotional symptoms. ?Always see a health care provider to have the reasons for the panic attack correctly diagnosed. ?If your panic attack was caused by a  physical problem, follow your health care provider's suggestions for medicine, referral to a specialist, and lifestyle changes. ?If your panic attack was caused by an emotional problem, follow through with counseling from a qualified mental health specialist. ?If you feel like you may hurt yourself or others, call 911 and get help right away. ?This information is not intended to replace advice given to you by your health care provider. Make sure you discuss any questions you have with your health care provider. ?Document Revised: 08/15/2020 Document Reviewed: 08/15/2020 ?Elsevier Patient Education ? 2023 Elsevier Inc. ? ?

## 2021-06-19 ENCOUNTER — Telehealth (INDEPENDENT_AMBULATORY_CARE_PROVIDER_SITE_OTHER): Payer: Commercial Managed Care - PPO | Admitting: Family

## 2021-06-19 ENCOUNTER — Encounter: Payer: Self-pay | Admitting: Family

## 2021-06-19 DIAGNOSIS — F900 Attention-deficit hyperactivity disorder, predominantly inattentive type: Secondary | ICD-10-CM

## 2021-06-19 MED ORDER — LISDEXAMFETAMINE DIMESYLATE 30 MG PO CAPS: 30.0000 mg | ORAL_CAPSULE | Freq: Every day | ORAL | 0 refills | Status: DC

## 2021-06-19 NOTE — Progress Notes (Signed)
Virtual Visit Consent   Michelle Delgado, you are scheduled for a virtual visit with a [facility] provider today. Just as with appointments in the office, your consent must be obtained to participate. Your consent will be active for this visit and any virtual visit you may have with one of our providers in the next 365 days. If you have a MyChart account, a copy of this consent can be sent to you electronically.  As this is a virtual visit, video technology does not allow for your provider to perform a traditional examination. This may limit your provider's ability to fully assess your condition. If your provider identifies any concerns that need to be evaluated in person or the need to arrange testing (such as labs, EKG, etc.), we will make arrangements to do so. Although advances in technology are sophisticated, we cannot ensure that it will always work on either your end or our end. If the connection with a video visit is poor, the visit may have to be switched to a telephone visit. With either a video or telephone visit, we are not always able to ensure that we have a secure connection.  By engaging in this virtual visit, you consent to the provision of healthcare and authorize for your insurance to be billed (if applicable) for the services provided during this visit. Depending on your insurance coverage, you may receive a charge related to this service.  I need to obtain your verbal consent now. Are you willing to proceed with your visit today? ZAEDA MCFERRAN has provided verbal consent on 06/19/2021 for a virtual visit (video or telephone). Jannifer Rodney, FNP  Date: 06/19/2021 12:32 PM  Virtual Visit via Video Note   I, Jannifer Rodney, connected with  Michelle Delgado  ([id number], [birth year]) on 06/19/21 at 12:25 PM EDT by a video-enabled telemedicine application and verified that I am speaking with the correct person using two identifiers.  Location: Patient: Virtual Visit Location Patient:  Other: car Provider: Virtual Visit Location Provider: Office/Clinic   I discussed the limitations of evaluation and management by telemedicine and the availability of in person appointments. The patient expressed understanding and agreed to proceed.    History of Present Illness: Michelle Delgado is a 46 y.o. who identifies as a female who was assigned female at birth, and is being seen today for ADHD. She is currently seeing a therapists weekly. She was diagnosed with ADHD and GAD. She reports she has a hard time regulating her emotions. She has a hard time carrying on a conversation because she "zones out". She can not complete tasks or stay focused on one certain task.   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   Depression, major, single episode, mild (HCC) 11/14/2019   Panic attack 08/19/2017   Mood disorder (HCC) 09/03/2015   Hypothyroidism 08/16/2015   History of anemia 08/16/2015    Allergies:  Allergies  Allergen Reactions   Sulfa Antibiotics Rash   Medications:  Current Outpatient Medications:    lisdexamfetamine (VYVANSE) 30 MG capsule, Take 1 capsule (30 mg total) by mouth daily before breakfast., Disp: 30 capsule, Rfl: 0   [START ON 07/19/2021] lisdexamfetamine (VYVANSE) 30 MG capsule, Take 1 capsule (30 mg total) by mouth daily before breakfast., Disp: 30 capsule, Rfl: 0   [START ON 08/18/2021] lisdexamfetamine (VYVANSE) 30 MG capsule, Take 1 capsule (  30 mg total) by mouth daily before breakfast., Disp: 30 capsule, Rfl: 0   baclofen (LIORESAL) 10 MG tablet, Take 1 tablet (10 mg total) by mouth 3 (three) times daily as needed for muscle spasms., Disp: 30 each, Rfl: 0   busPIRone (BUSPAR) 5 MG tablet, Take 1 tablet (5 mg total) by mouth 3 (three) times daily as needed., Disp: 60 tablet, Rfl: 2   levothyroxine (SYNTHROID) 112 MCG tablet, Take 1  tablet (112 mcg total) by mouth daily., Disp: 90 tablet, Rfl: 2   meclizine (ANTIVERT) 12.5 MG tablet, Take 1 tablet (12.5 mg total) by mouth 3 (three) times daily as needed for dizziness., Disp: 30 tablet, Rfl: 0   norethindrone-ethinyl estradiol-FE (LOESTRIN FE) 1-20 MG-MCG tablet, Take 1 tablet by mouth daily., Disp: 90 tablet, Rfl: 3  Observations/Objective: Patient is well-developed, well-nourished in no acute distress.  Resting comfortably in car Head is normocephalic, atraumatic.  No labored breathing.  Speech is clear and coherent with logical content.  Patient is alert and oriented at baseline.    Assessment and Plan: 1. Attention deficit hyperactivity disorder (ADHD), predominantly inattentive type - lisdexamfetamine (VYVANSE) 30 MG capsule; Take 1 capsule (30 mg total) by mouth daily before breakfast.  Dispense: 30 capsule; Refill: 0 - lisdexamfetamine (VYVANSE) 30 MG capsule; Take 1 capsule (30 mg total) by mouth daily before breakfast.  Dispense: 30 capsule; Refill: 0 - lisdexamfetamine (VYVANSE) 30 MG capsule; Take 1 capsule (30 mg total) by mouth daily before breakfast.  Dispense: 30 capsule; Refill: 0  Will start Vyvanse 30 mg today Behavior modification as needed Follow-up for recheck in 3 months  Follow Up Instructions: I discussed the assessment and treatment plan with the patient. The patient was provided an opportunity to ask questions and all were answered. The patient agreed with the plan and demonstrated an understanding of the instructions.  A copy of instructions were sent to the patient via MyChart unless otherwise noted below.     The patient was advised to call back or seek an in-person evaluation if the symptoms worsen or if the condition fails to improve as anticipated.  Time:  I spent 13 minutes with the patient via telehealth technology discussing the above problems/concerns.    Jannifer Rodney, FNP

## 2021-06-19 NOTE — Patient Instructions (Signed)
Living With Attention Deficit Hyperactivity Disorder If you have been diagnosed with attention deficit hyperactivity disorder (ADHD), you may be relieved that you now know why you have felt or behaved a certain way. Still, you may feel overwhelmed about the treatment ahead. You may also wonder how to get the support you need and how to deal with the condition day-to-day. With treatment and support, you can live with ADHD and manage your symptoms. How to manage lifestyle changes Managing stress Stress is your body's reaction to life changes and events, both good and bad. To cope with the stress of an ADHD diagnosis, it may help to: Learn more about ADHD. Exercise regularly. Even a short daily walk can lower stress levels. Participate in training or education programs (including social skills training classes) that teach you to deal with symptoms.  Medicines Your health care provider may suggest certain medicines if he or she feels that they will help to improve your condition. Stimulant medicines are usually prescribed to treat ADHD, and therapy may also be prescribed. It is important to: Avoid using alcohol and other substances that may prevent your medicines from working properly (may interact). Talk with your pharmacist or health care provider about all the medicines that you take, their possible side effects, and what medicines are safe to take together. Make it your goal to take part in all treatment decisions (shared decision-making). Ask about possible side effects of medicines that your health care provider recommends, and tell him or her how you feel about having those side effects. It is best if shared decision-making with your health care provider is part of your total treatment plan. Relationships To strengthen your relationships with family members while treating your condition, consider taking part in family therapy. You might also attend self-help groups alone or with a loved one. Be  honest about how your symptoms affect your relationships. Make an effort to communicate respectfully instead of fighting, and find ways to show others that you care. Psychotherapy may be useful in helping you cope with how ADHD affects your relationships. How to recognize changes in your condition The following signs may mean that your treatment is working well and your condition is improving: Consistently being on time for appointments. Being more organized at home and work. Other people noticing improvements in your behavior. Achieving goals that you set for yourself. Thinking more clearly. The following signs may mean that your treatment is not working very well: Feeling impatience or more confusion. Missing, forgetting, or being late for appointments. An increasing sense of disorganization and messiness. More difficulty in reaching goals that you set for yourself. Loved ones becoming angry or frustrated with you. Follow these instructions at home: Take over-the-counter and prescription medicines only as told by your health care provider. Check with your health care provider before taking any new medicines. Create structure and an organized atmosphere at home. For example: Make a list of tasks, then rank them from most important to least important. Work on one task at a time until your listed tasks are done. Make a daily schedule and follow it consistently every day. Use an appointment calendar, and check it 2 or 3 times a day to keep on track. Keep it with you when you leave the house. Create spaces where you keep certain things, and always put things back in their places after you use them. Keep all follow-up visits as told by your health care provider. This is important. Where to find support Talking to others    Keep emotion out of important discussions and speak in a calm, logical way. Listen closely and patiently to your loved ones. Try to understand their point of view, and try to  avoid getting defensive. Take responsibility for the consequences of your actions. Ask that others do not take your behaviors personally. Aim to solve problems as they come up, and express your feelings instead of bottling them up. Talk openly about what you need from your loved ones and how they can support you. Consider going to family therapy sessions or having your family meet with a specialist who deals with ADHD-related behavior problems. Finances Not all insurance plans cover mental health care, so it is important to check with your insurance carrier. If paying for co-pays or counseling services is a problem, search for a local or county mental health care center. Public mental health care services may be offered there at a low cost or no cost when you are not able to see a private health care provider. If you are taking medicine for ADHD, you may be able to get the generic form, which may be less expensive than brand-name medicine. Some makers of prescription medicines also offer help to patients who cannot afford the medicines that they need. Questions to ask your health care provider: What are the risks and benefits of taking medicines? Would I benefit from therapy? How often should I follow up with a health care provider? Contact a health care provider if: You have side effects from your medicines, such as: Repeated muscle twitches, coughing, or speech outbursts. Sleep problems. Loss of appetite. Depression. New or worsening behavior problems. Dizziness. Unusually fast heartbeat. Stomach pains. Headaches. Get help right away if: You have a severe reaction to a medicine. Your behavior suddenly gets worse. Summary With treatment and support, you can live with ADHD and manage your symptoms. The medicines that are most often prescribed for ADHD are stimulants. Consider taking part in family therapy or self-help groups with family members or friends. When you talk with friends  and family about your ADHD, be patient and communicate openly. Take over-the-counter and prescription medicines only as told by your health care provider. Check with your health care provider before taking any new medicines. This information is not intended to replace advice given to you by your health care provider. Make sure you discuss any questions you have with your health care provider. Document Revised: 05/19/2019 Document Reviewed: 06/21/2019 Elsevier Patient Education  2023 Elsevier Inc.  

## 2021-06-24 MED ORDER — METHYLPHENIDATE HCL ER (OSM) 36 MG PO TBCR: 36.0000 mg | EXTENDED_RELEASE_TABLET | Freq: Every day | ORAL | 0 refills | Status: DC

## 2021-06-25 ENCOUNTER — Encounter: Payer: Self-pay | Admitting: *Deleted

## 2021-06-25 DIAGNOSIS — N63 Unspecified lump in unspecified breast: Secondary | ICD-10-CM | POA: Insufficient documentation

## 2021-07-07 ENCOUNTER — Other Ambulatory Visit: Payer: Self-pay | Admitting: Nurse Practitioner

## 2021-07-07 NOTE — Telephone Encounter (Signed)
Medication is going through [facility], we will get back to you soon as insurance approves medication. thanks

## 2021-07-16 ENCOUNTER — Inpatient Hospital Stay (HOSPITAL_COMMUNITY): Admission: RE | Admit: 2021-07-16 | Payer: Commercial Managed Care - PPO | Source: Ambulatory Visit

## 2021-07-16 DIAGNOSIS — Z1231 Encounter for screening mammogram for malignant neoplasm of breast: Secondary | ICD-10-CM

## 2021-07-17 ENCOUNTER — Other Ambulatory Visit (HOSPITAL_COMMUNITY): Payer: Self-pay | Admitting: Family

## 2021-07-17 DIAGNOSIS — Z1231 Encounter for screening mammogram for malignant neoplasm of breast: Secondary | ICD-10-CM

## 2021-07-17 MED ORDER — METHYLPHENIDATE HCL ER (OSM) 36 MG PO TBCR: 36.0000 mg | EXTENDED_RELEASE_TABLET | Freq: Every day | ORAL | 0 refills | Status: DC

## 2021-07-17 NOTE — Addendum Note (Signed)
Addended by: Jannifer Rodney A on: 07/17/2021 01:55 PM   Modules accepted: Orders

## 2021-07-17 NOTE — Telephone Encounter (Signed)
Can we check on Vyvanse PA. Pt states she got letter stating we never did one. It may have been they told us to try another medication?

## 2021-07-21 ENCOUNTER — Ambulatory Visit (HOSPITAL_COMMUNITY)
Admission: RE | Admit: 2021-07-21 | Discharge: 2021-07-21 | Disposition: A | Discharge status: Home / Self Care | Payer: Commercial Managed Care - PPO | Source: Ambulatory Visit | Attending: Family | Admitting: Family

## 2021-07-21 DIAGNOSIS — Z1231 Encounter for screening mammogram for malignant neoplasm of breast: Secondary | ICD-10-CM | POA: Diagnosis present

## 2021-07-25 ENCOUNTER — Telehealth (INDEPENDENT_AMBULATORY_CARE_PROVIDER_SITE_OTHER): Payer: Commercial Managed Care - PPO | Admitting: Family

## 2021-07-25 ENCOUNTER — Encounter: Payer: Self-pay | Admitting: Family

## 2021-07-25 DIAGNOSIS — F900 Attention-deficit hyperactivity disorder, predominantly inattentive type: Secondary | ICD-10-CM | POA: Diagnosis not present

## 2021-07-25 DIAGNOSIS — R110 Nausea: Secondary | ICD-10-CM | POA: Diagnosis not present

## 2021-07-25 DIAGNOSIS — G4700 Insomnia, unspecified: Secondary | ICD-10-CM | POA: Diagnosis not present

## 2021-07-25 MED ORDER — METHYLPHENIDATE HCL ER (OSM) 18 MG PO TBCR: 18.0000 mg | EXTENDED_RELEASE_TABLET | Freq: Every day | ORAL | 0 refills | Status: DC

## 2021-07-25 NOTE — Progress Notes (Signed)
Virtual Visit Consent   Michelle Delgado, you are scheduled for a virtual visit with a [facility] provider today. Just as with appointments in the office, your consent must be obtained to participate. Your consent will be active for this visit and any virtual visit you may have with one of our providers in the next 365 days. If you have a MyChart account, a copy of this consent can be sent to you electronically.  As this is a virtual visit, video technology does not allow for your provider to perform a traditional examination. This may limit your provider's ability to fully assess your condition. If your provider identifies any concerns that need to be evaluated in person or the need to arrange testing (such as labs, EKG, etc.), we will make arrangements to do so. Although advances in technology are sophisticated, we cannot ensure that it will always work on either your end or our end. If the connection with a video visit is poor, the visit may have to be switched to a telephone visit. With either a video or telephone visit, we are not always able to ensure that we have a secure connection.  By engaging in this virtual visit, you consent to the provision of healthcare and authorize for your insurance to be billed (if applicable) for the services provided during this visit. Depending on your insurance coverage, you may receive a charge related to this service.  I need to obtain your verbal consent now. Are you willing to proceed with your visit today? Michelle Delgado has provided verbal consent on 07/25/2021 for a virtual visit (video or telephone). Jannifer Rodney, FNP  Date: 07/25/2021 2:35 PM  Virtual Visit via Video Note   I, Jannifer Rodney, connected with  Michelle Delgado  ([id number], 15-May-1975) on 07/25/21 at 11:25 AM EDT by a video-enabled telemedicine application and verified that I am speaking with the correct person using two identifiers.  Location: Patient: Virtual Visit Location Patient:  Work Provider: Engineer, mining Provider: Home Office   I discussed the limitations of evaluation and management by telemedicine and the availability of in person appointments. The patient expressed understanding and agreed to proceed.    History of Present Illness: Michelle Delgado is a 46 y.o. who identifies as a female who was assigned female at birth, and is being seen today to discuss Concerta. She is currently taking Concerta 36 mg daily. It is helping with her overstimulation, staying on task, and focusing. However, she has felt insomnia and nausea. She is requesting to decrease the dose.   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   Depression, major, single episode, mild (HCC) 11/14/2019   Panic attack 08/19/2017   Mood disorder (HCC) 09/03/2015   Hypothyroidism 08/16/2015   History of anemia 08/16/2015   Chronic headache 05/19/2009    Allergies:  Allergies  Allergen Reactions   Sulfa Antibiotics Rash   Medications:  Current Outpatient Medications:    methylphenidate (CONCERTA) 18 MG PO CR tablet, Take 1 tablet (18 mg total) by mouth daily before breakfast., Disp: 30 tablet, Rfl: 0   [START ON 08/24/2021] methylphenidate (CONCERTA) 18 MG PO CR tablet, Take 1 tablet (18 mg total) by mouth daily before breakfast., Disp: 30 tablet, Rfl: 0   [START ON 09/23/2021] methylphenidate (CONCERTA) 18  MG PO CR tablet, Take 1 tablet (18 mg total) by mouth daily before breakfast., Disp: 30 tablet, Rfl: 0   baclofen (LIORESAL) 10 MG tablet, Take 1 tablet (10 mg total) by mouth 3 (three) times daily as needed for muscle spasms., Disp: 30 each, Rfl: 0   busPIRone (BUSPAR) 5 MG tablet, Take 1 tablet (5 mg total) by mouth 3 (three) times daily as needed., Disp: 60 tablet, Rfl: 2   levothyroxine (SYNTHROID) 112  MCG tablet, Take 1 tablet (112 mcg total) by mouth daily., Disp: 90 tablet, Rfl: 2   meclizine (ANTIVERT) 12.5 MG tablet, Take 1 tablet (12.5 mg total) by mouth 3 (three) times daily as needed for dizziness., Disp: 30 tablet, Rfl: 0   norethindrone-ethinyl estradiol-FE (LOESTRIN FE) 1-20 MG-MCG tablet, Take 1 tablet by mouth daily., Disp: 90 tablet, Rfl: 3  Observations/Objective: Patient is well-developed, well-nourished in no acute distress.  Resting comfortably  at home.  Head is normocephalic, atraumatic.  No labored breathing.  Speech is clear and coherent with logical content.  Patient is alert and oriented at baseline.    Assessment and Plan: 1. Attention deficit hyperactivity disorder (ADHD), predominantly inattentive type - methylphenidate (CONCERTA) 18 MG PO CR tablet; Take 1 tablet (18 mg total) by mouth daily before breakfast.  Dispense: 30 tablet; Refill: 0 - methylphenidate (CONCERTA) 18 MG PO CR tablet; Take 1 tablet (18 mg total) by mouth daily before breakfast.  Dispense: 30 tablet; Refill: 0 - methylphenidate (CONCERTA) 18 MG PO CR tablet; Take 1 tablet (18 mg total) by mouth daily before breakfast.  Dispense: 30 tablet; Refill: 0  2. Nausea  3. Insomnia, unspecified type  Will decrease Concerta to 18 mg Meds as prescribed Behavior modification as needed Follow-up for recheck in 3 months  Follow Up Instructions: I discussed the assessment and treatment plan with the patient. The patient was provided an opportunity to ask questions and all were answered. The patient agreed with the plan and demonstrated an understanding of the instructions.  A copy of instructions were sent to the patient via MyChart unless otherwise noted below.     The patient was advised to call back or seek an in-person evaluation if the symptoms worsen or if the condition fails to improve as anticipated.  Time:  I spent 14 minutes with the patient via telehealth technology discussing the  above problems/concerns.    Jannifer Rodney, FNP

## 2021-08-01 ENCOUNTER — Telehealth: Payer: Commercial Managed Care - PPO | Admitting: Family

## 2021-08-14 ENCOUNTER — Telehealth: Payer: Commercial Managed Care - PPO | Admitting: Family

## 2021-08-15 ENCOUNTER — Encounter: Payer: Commercial Managed Care - PPO | Admitting: Family

## 2021-08-18 ENCOUNTER — Other Ambulatory Visit: Payer: Self-pay | Admitting: Family

## 2021-08-18 DIAGNOSIS — N9489 Other specified conditions associated with female genital organs and menstrual cycle: Secondary | ICD-10-CM

## 2021-08-26 ENCOUNTER — Ambulatory Visit: Payer: Commercial Managed Care - PPO | Admitting: Family

## 2021-08-27 ENCOUNTER — Ambulatory Visit (HOSPITAL_COMMUNITY)
Admission: RE | Admit: 2021-08-27 | Discharge: 2021-08-27 | Disposition: A | Discharge status: Home / Self Care | Payer: Commercial Managed Care - PPO | Source: Ambulatory Visit | Attending: Family | Admitting: Family

## 2021-08-27 DIAGNOSIS — N9489 Other specified conditions associated with female genital organs and menstrual cycle: Secondary | ICD-10-CM | POA: Diagnosis not present

## 2021-10-10 ENCOUNTER — Ambulatory Visit: Payer: Commercial Managed Care - PPO | Admitting: Family

## 2021-10-10 ENCOUNTER — Encounter: Payer: Commercial Managed Care - PPO | Admitting: Family

## 2021-10-28 ENCOUNTER — Encounter: Payer: Commercial Managed Care - PPO | Admitting: Family

## 2021-11-13 ENCOUNTER — Other Ambulatory Visit (HOSPITAL_COMMUNITY)
Admission: RE | Admit: 2021-11-13 | Discharge: 2021-11-13 | Disposition: A | Discharge status: Home / Self Care | Payer: Commercial Managed Care - PPO | Source: Ambulatory Visit | Attending: Family | Admitting: Family

## 2021-11-13 ENCOUNTER — Encounter: Payer: Self-pay | Admitting: Family

## 2021-11-13 ENCOUNTER — Ambulatory Visit (INDEPENDENT_AMBULATORY_CARE_PROVIDER_SITE_OTHER): Payer: Commercial Managed Care - PPO | Admitting: Family

## 2021-11-13 VITALS — BP 128/88 | HR 78 | Temp 97.9°F | Ht 64.0 in | Wt 156.8 lb

## 2021-11-13 DIAGNOSIS — Z0000 Encounter for general adult medical examination without abnormal findings: Secondary | ICD-10-CM

## 2021-11-13 DIAGNOSIS — F39 Unspecified mood [affective] disorder: Secondary | ICD-10-CM

## 2021-11-13 DIAGNOSIS — F320 Major depressive disorder, single episode, mild: Secondary | ICD-10-CM

## 2021-11-13 DIAGNOSIS — Z0001 Encounter for general adult medical examination with abnormal findings: Secondary | ICD-10-CM

## 2021-11-13 DIAGNOSIS — Z01419 Encounter for gynecological examination (general) (routine) without abnormal findings: Secondary | ICD-10-CM

## 2021-11-13 DIAGNOSIS — Z23 Encounter for immunization: Secondary | ICD-10-CM | POA: Diagnosis not present

## 2021-11-13 DIAGNOSIS — F411 Generalized anxiety disorder: Secondary | ICD-10-CM

## 2021-11-13 DIAGNOSIS — E039 Hypothyroidism, unspecified: Secondary | ICD-10-CM | POA: Diagnosis not present

## 2021-11-13 NOTE — Patient Instructions (Signed)

## 2021-11-13 NOTE — Addendum Note (Signed)
Addended by: Evelina Dun A on: 11/13/2021 03:38 PM   Modules accepted: Orders

## 2021-11-13 NOTE — Progress Notes (Addendum)
Subjective:    Patient ID: Michelle Delgado, female    DOB: [birth year], 46 y.o.   MRN: [id number]  Chief Complaint  Patient presents with   Gynecologic Exam   Pt presents to the office today for CPE with pap.  Gynecologic Exam The patient's pertinent negatives include no genital itching or vaginal discharge. Pertinent negatives include no constipation.  Thyroid Problem Presents for follow-up visit. Symptoms include anxiety and depressed mood. Patient reports no constipation, fatigue or hoarse voice. The symptoms have been stable.  Anxiety Presents for follow-up visit. Symptoms include depressed mood, excessive worry, irritability, nervous/anxious behavior and restlessness. Symptoms occur occasionally. The severity of symptoms is mild. The quality of sleep is good.    Depression        This is a chronic problem.  The current episode started more than 1 year ago.   Associated symptoms include restlessness.  Associated symptoms include no fatigue, no helplessness and no hopelessness.  Past medical history includes thyroid problem and anxiety.       Review of Systems  Constitutional:  Positive for irritability. Negative for fatigue.  HENT:  Negative for hoarse voice.   Gastrointestinal:  Negative for constipation.  Genitourinary:  Negative for vaginal discharge.  Psychiatric/Behavioral:  Positive for depression. The patient is nervous/anxious.   All other systems reviewed and are negative.  Family History  Problem Relation Age of Onset   Thyroid disease Mother    Graves' disease Sister    Aneurysm Maternal Grandmother    Social History   Socioeconomic History   Marital status: Married    Spouse name: Not on file   Number of children: Not on file   Years of education: Not on file   Highest education level: Not on file  Occupational History   Not on file  Tobacco Use   Smoking status: Never   Smokeless tobacco: Never  Substance and Sexual Activity   Alcohol use: Yes     Alcohol/week: 1.0 standard drink of alcohol    Types: 1 Glasses of wine per week   Drug use: No   Sexual activity: Yes    Birth control/protection: Pill  Other Topics Concern   Not on file  Social History Narrative   Not on file   Social Determinants of Health   Financial Resource Strain: Not on file  Food Insecurity: Not on file  Transportation Needs: Not on file  Physical Activity: Not on file  Stress: Not on file  Social Connections: Not on file       Objective:   Physical Exam Vitals reviewed.  Constitutional:      General: She is not in acute distress.    Appearance: She is well-developed.  HENT:     Head: Normocephalic and atraumatic.     Right Ear: Tympanic membrane normal.     Left Ear: Tympanic membrane normal.  Eyes:     Pupils: Pupils are equal, round, and reactive to light.  Neck:     Thyroid: No thyromegaly.  Cardiovascular:     Rate and Rhythm: Normal rate and regular rhythm.     Heart sounds: Normal heart sounds. No murmur heard. Pulmonary:     Effort: Pulmonary effort is normal. No respiratory distress.     Breath sounds: Normal breath sounds. No wheezing.  Abdominal:     General: Bowel sounds are normal. There is no distension.     Palpations: Abdomen is soft.     Tenderness: There is no abdominal  tenderness.  Musculoskeletal:        General: No tenderness. Normal range of motion.     Cervical back: Normal range of motion and neck supple.  Skin:    General: Skin is warm and dry.  Neurological:     Mental Status: She is alert and oriented to person, place, and time.     Cranial Nerves: No cranial nerve deficit.     Deep Tendon Reflexes: Reflexes are normal and symmetric.  Psychiatric:        Behavior: Behavior normal.        Thought Content: Thought content normal.        Judgment: Judgment normal.       BP 128/88   Pulse 78   Temp 97.9 F (36.6 C) (Temporal)   Ht 5' 4" (1.626 m)   Wt 156 lb 12.8 oz (71.1 kg)   LMP 10/23/2021    SpO2 94%   BMI 26.91 kg/m      Assessment & Plan:  Michelle Delgado comes in today with chief complaint of Gynecologic Exam   Diagnosis and orders addressed:  1. Annual physical exam - CMP14+EGFR - CBC with Differential/Platelet - Lipid panel - TSH  2. Hypothyroidism, unspecified type - CMP14+EGFR - CBC with Differential/Platelet - TSH  3. GAD (generalized anxiety disorder) - CMP14+EGFR - CBC with Differential/Platelet  4. Depression, major, single episode, mild (HCC) - CMP14+EGFR - CBC with Differential/Platelet  5. Mood disorder (HCC)  - CMP14+EGFR - CBC with Differential/Platelet  6. Need for immunization against influenza - Flu Vaccine QUAD 28moIM (Fluarix, Fluzone & Alfiuria Quad PF)  7. Gynecologic exam normal  - Cytology - PAP([facility])    Labs pending Health Maintenance reviewed Diet and exercise encouraged  Follow up plan: 1 year    CEvelina Dun FNP

## 2021-11-14 LAB — CBC WITH DIFFERENTIAL/PLATELET
Basophils Absolute: 0 10*3/uL (ref 0.0–0.2)
Basos: 1 %
EOS (ABSOLUTE): 0.1 10*3/uL (ref 0.0–0.4)
Eos: 2 %
Hematocrit: 39.3 % (ref 34.0–46.6)
Hemoglobin: 13.3 g/dL (ref 11.1–15.9)
Immature Grans (Abs): 0.1 10*3/uL (ref 0.0–0.1)
Immature Granulocytes: 1 %
Lymphocytes Absolute: 1.7 10*3/uL (ref 0.7–3.1)
Lymphs: 28 %
MCH: 31.7 pg (ref 26.6–33.0)
MCHC: 33.8 g/dL (ref 31.5–35.7)
MCV: 94 fL (ref 79–97)
Monocytes Absolute: 0.4 10*3/uL (ref 0.1–0.9)
Monocytes: 7 %
Neutrophils Absolute: 3.8 10*3/uL (ref 1.4–7.0)
Neutrophils: 61 %
Platelets: 284 10*3/uL (ref 150–450)
RBC: 4.19 x10E6/uL (ref 3.77–5.28)
RDW: 11.8 % (ref 11.7–15.4)
WBC: 6 10*3/uL (ref 3.4–10.8)

## 2021-11-14 LAB — CMP14+EGFR
ALT: 10 IU/L (ref 0–32)
AST: 16 IU/L (ref 0–40)
Albumin/Globulin Ratio: 1.8 (ref 1.2–2.2)
Albumin: 4.6 g/dL (ref 3.9–4.9)
Alkaline Phosphatase: 62 IU/L (ref 44–121)
BUN/Creatinine Ratio: 17 (ref 9–23)
BUN: 16 mg/dL (ref 6–24)
Bilirubin Total: 0.3 mg/dL (ref 0.0–1.2)
CO2: 23 mmol/L (ref 20–29)
Calcium: 9.8 mg/dL (ref 8.7–10.2)
Chloride: 101 mmol/L (ref 96–106)
Creatinine, Ser: 0.95 mg/dL (ref 0.57–1.00)
Globulin, Total: 2.5 g/dL (ref 1.5–4.5)
Glucose: 89 mg/dL (ref 70–99)
Potassium: 4.3 mmol/L (ref 3.5–5.2)
Sodium: 140 mmol/L (ref 134–144)
Total Protein: 7.1 g/dL (ref 6.0–8.5)
eGFR: 75 mL/min/{1.73_m2} (ref >59)

## 2021-11-14 LAB — LIPID PANEL
Chol/HDL Ratio: 2.9 ratio (ref 0.0–4.4)
Cholesterol, Total: 225 mg/dL — ABNORMAL HIGH (ref 100–199)
HDL: 78 mg/dL (ref >39)
LDL Chol Calc (NIH): 126 mg/dL — ABNORMAL HIGH (ref 0–99)
Triglycerides: 124 mg/dL (ref 0–149)
VLDL Cholesterol Cal: 21 mg/dL (ref 5–40)

## 2021-11-14 LAB — TSH: TSH: 0.618 u[IU]/mL (ref 0.450–4.500)

## 2021-11-18 LAB — CYTOLOGY - PAP
Chlamydia: NEGATIVE
Comment: NEGATIVE
Comment: NORMAL
Diagnosis: NEGATIVE
Neisseria Gonorrhea: NEGATIVE

## 2021-12-04 MED ORDER — BUSPIRONE HCL 7.5 MG PO TABS: 7.5000 mg | ORAL_TABLET | Freq: Three times a day (TID) | ORAL | 1 refills | Status: DC | PRN

## 2022-03-31 ENCOUNTER — Other Ambulatory Visit: Payer: Self-pay | Admitting: Family

## 2022-03-31 DIAGNOSIS — N946 Dysmenorrhea, unspecified: Secondary | ICD-10-CM

## 2022-04-03 ENCOUNTER — Other Ambulatory Visit: Payer: Self-pay | Admitting: Family

## 2022-04-03 DIAGNOSIS — N946 Dysmenorrhea, unspecified: Secondary | ICD-10-CM

## 2022-04-03 MED ORDER — NORETHIN ACE-ETH ESTRAD-FE 1-20 MG-MCG PO TABS: 1.0000 | ORAL_TABLET | Freq: Every day | ORAL | 1 refills | Status: AC

## 2022-04-09 ENCOUNTER — Telehealth: Payer: Commercial Managed Care - PPO | Admitting: Physician Assistant

## 2022-04-09 DIAGNOSIS — J208 Acute bronchitis due to other specified organisms: Secondary | ICD-10-CM | POA: Diagnosis not present

## 2022-04-09 MED ORDER — PREDNISONE 10 MG (21) PO TBPK: ORAL_TABLET | ORAL | 0 refills | Status: DC

## 2022-04-09 MED ORDER — BENZONATATE 100 MG PO CAPS: 100.0000 mg | ORAL_CAPSULE | Freq: Three times a day (TID) | ORAL | 0 refills | Status: DC | PRN

## 2022-04-09 NOTE — Progress Notes (Signed)
I have spent 5 minutes in review of e-visit questionnaire, review and updating patient chart, medical decision making and response to patient.   Antanette Richwine Cody Krissia Schreier, PA-C    

## 2022-04-09 NOTE — Progress Notes (Signed)
E-Visit for Cough  We are sorry that you are not feeling well.  Here is how we plan to help!  Based on your presentation I believe you most likely have A cough due to a virus.  This is called viral bronchitis and is best treated by rest, plenty of fluids and control of the cough.  You may use Ibuprofen or Tylenol as directed to help your symptoms.     In addition you may use A prescription cough medication called Tessalon Perles 100mg . You may take 1-2 capsules every 8 hours as needed for your cough. I have given a short course of prednisone as well to calm bronchospasm in the airways.   From your responses in the eVisit questionnaire you describe inflammation in the upper respiratory tract which is causing a significant cough.  This is commonly called Bronchitis and has four common causes:   Allergies Viral Infections Acid Reflux Bacterial Infection Allergies, viruses and acid reflux are treated by controlling symptoms or eliminating the cause. An example might be a cough caused by taking certain blood pressure medications. You stop the cough by changing the medication. Another example might be a cough caused by acid reflux. Controlling the reflux helps control the cough.  USE OF BRONCHODILATOR ("RESCUE") INHALERS: There is a risk from using your bronchodilator too frequently.  The risk is that over-reliance on a medication which only relaxes the muscles surrounding the breathing tubes can reduce the effectiveness of medications prescribed to reduce swelling and congestion of the tubes themselves.  Although you feel brief relief from the bronchodilator inhaler, your asthma may actually be worsening with the tubes becoming more swollen and filled with mucus.  This can delay other crucial treatments, such as oral steroid medications. If you need to use a bronchodilator inhaler daily, several times per day, you should discuss this with your provider.  There are probably better treatments that could be  used to keep your asthma under control.     HOME CARE Only take medications as instructed by your medical team. Complete the entire course of an antibiotic. Drink plenty of fluids and get plenty of rest. Avoid close contacts especially the very young and the elderly Cover your mouth if you cough or cough into your sleeve. Always remember to wash your hands A steam or ultrasonic humidifier can help congestion.   GET HELP RIGHT AWAY IF: You develop worsening fever. You become short of breath You cough up blood. Your symptoms persist after you have completed your treatment plan MAKE SURE YOU  Understand these instructions. Will watch your condition. Will get help right away if you are not doing well or get worse.    Thank you for choosing an e-visit.  Your e-visit answers were reviewed by a board certified advanced clinical practitioner to complete your personal care plan. Depending upon the condition, your plan could have included both over the counter or prescription medications.  Please review your pharmacy choice. Make sure the pharmacy is open so you can pick up prescription now. If there is a problem, you may contact your provider through [employer] and have the prescription routed to another pharmacy.  Your safety is important to [place]. If you have drug allergies check your prescription carefully.   For the next 24 hours you can use MyChart to ask questions about today's visit, request a non-urgent call back, or ask for a work or school excuse. You will get an email in the next two days asking about  your experience. I hope that your e-visit has been valuable and will speed your recovery.

## 2022-05-11 ENCOUNTER — Other Ambulatory Visit: Payer: Self-pay | Admitting: Family

## 2022-05-11 DIAGNOSIS — E039 Hypothyroidism, unspecified: Secondary | ICD-10-CM

## 2022-05-20 IMAGING — MG MM DIGITAL SCREENING BILAT W/ TOMO AND CAD
8 series · 8 of 24 positions shown · non-contrast
Comparison: Previous exam(s).

CLINICAL DATA: Screening.

EXAM:
DIGITAL SCREENING BILATERAL MAMMOGRAM WITH TOMOSYNTHESIS AND CAD
TECHNIQUE: Bilateral screening digital craniocaudal and mediolateral oblique
mammograms were obtained. Bilateral screening digital breast
tomosynthesis was performed. The images were evaluated with
computer-aided detection.

[L MLO synth-2D]
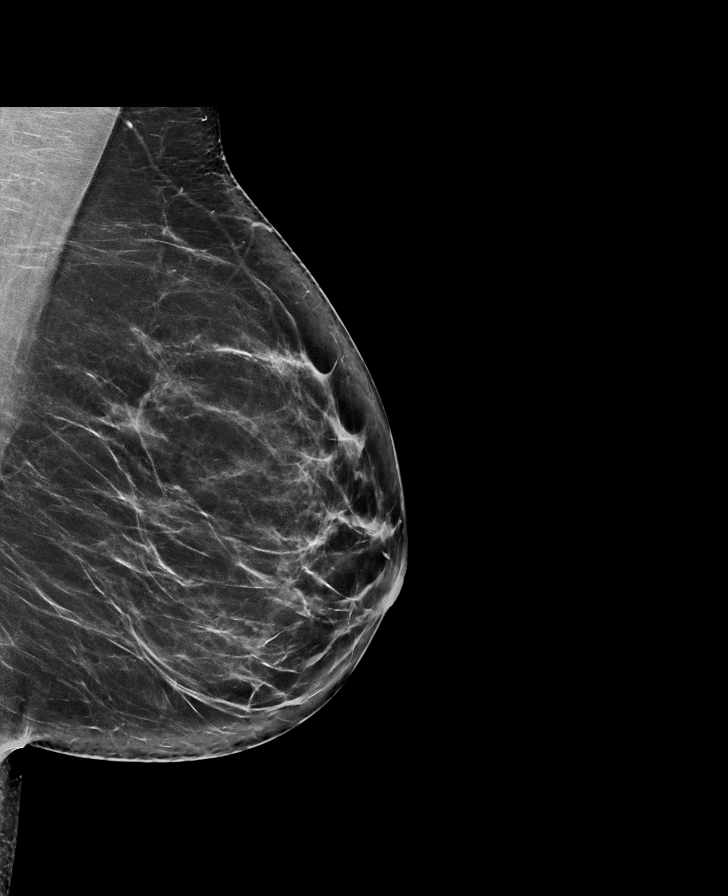

[R CC synth-2D]
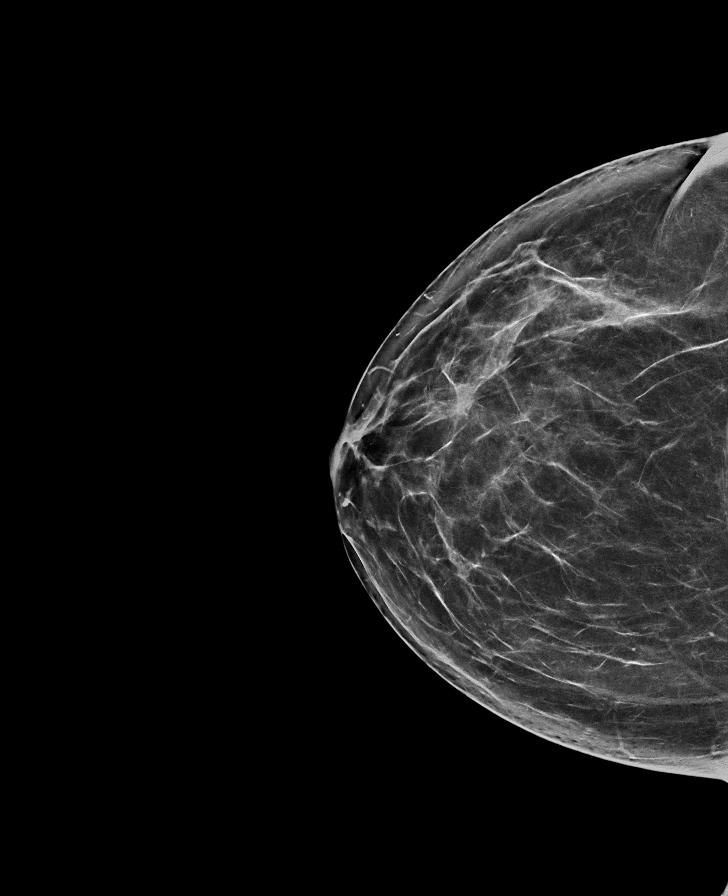

[L CC synth-2D]
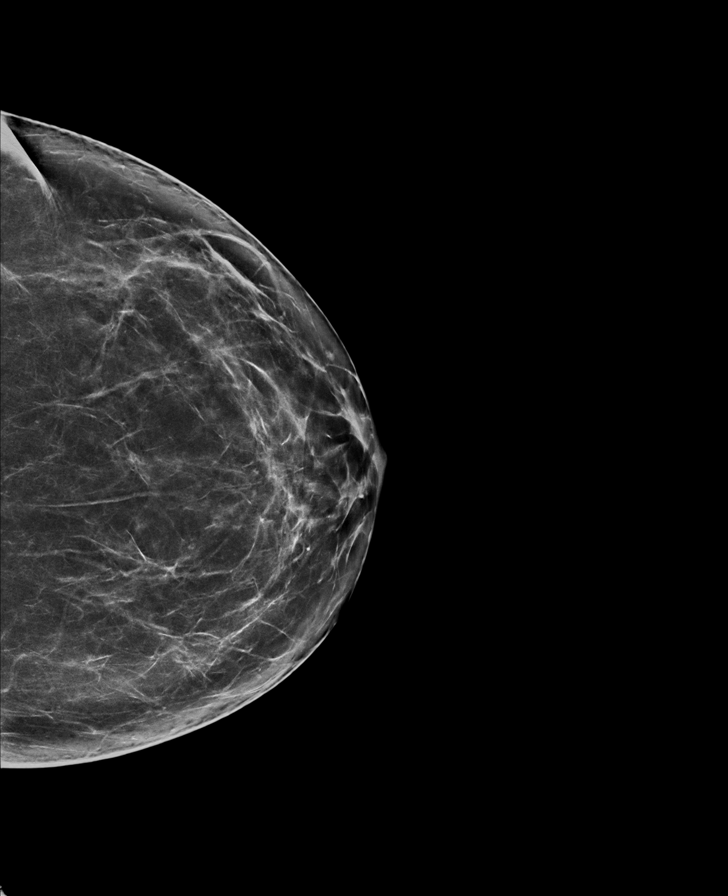

[R MLO synth-2D]
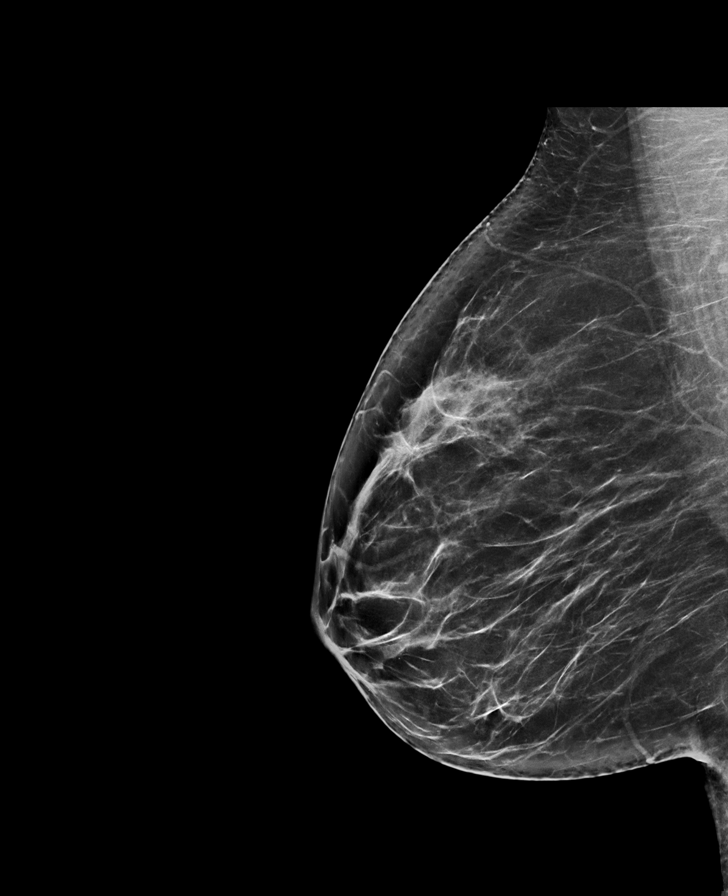

[R CC tomo · tomo slice 37/74.0]
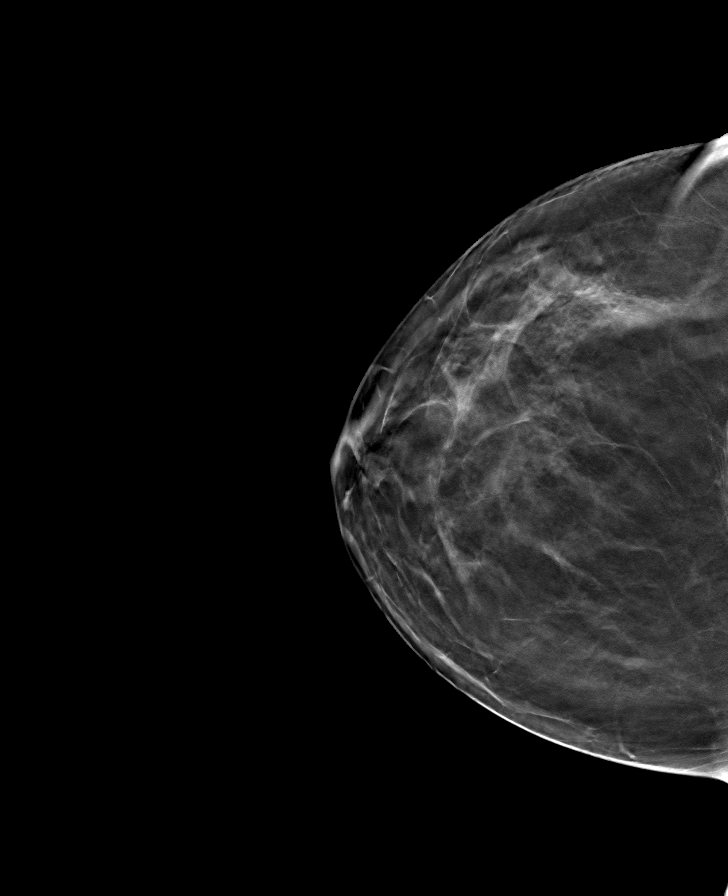

[L CC tomo · tomo slice 39/76.0]
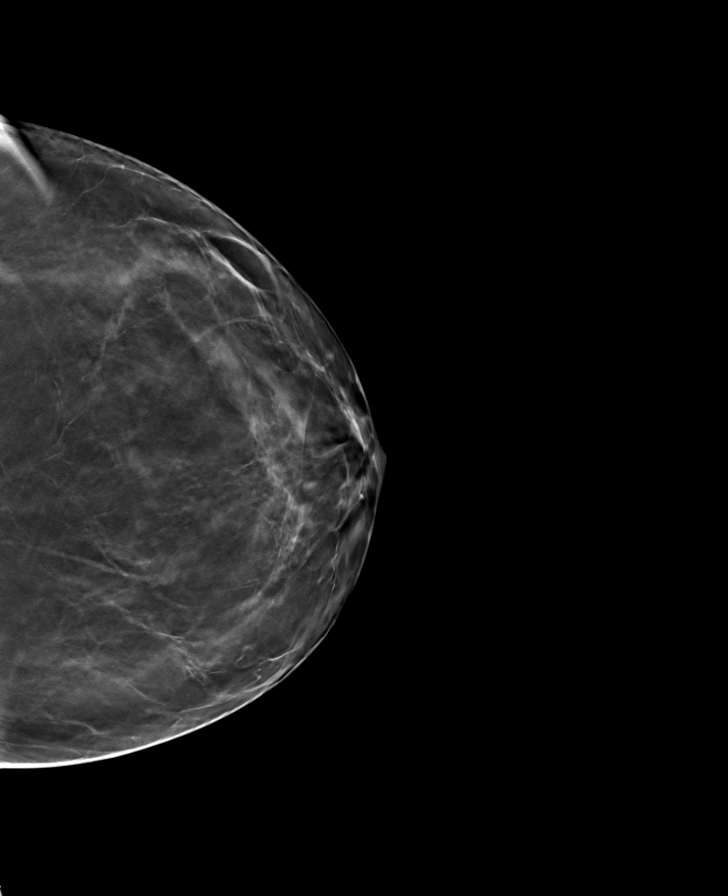

[R MLO tomo · tomo slice 40/79.0]
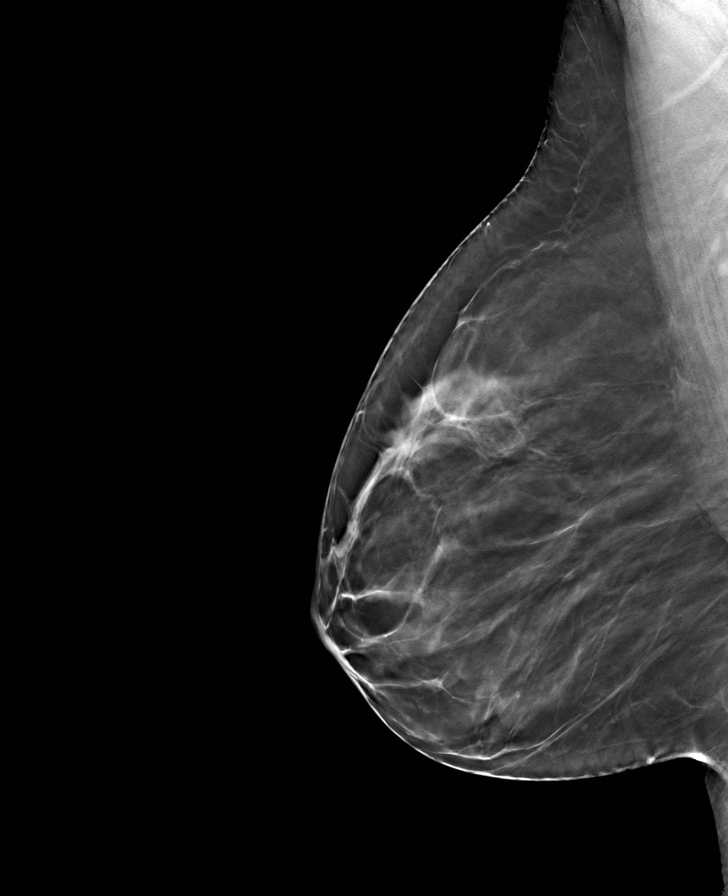

[L MLO tomo · tomo slice 41/82.0]
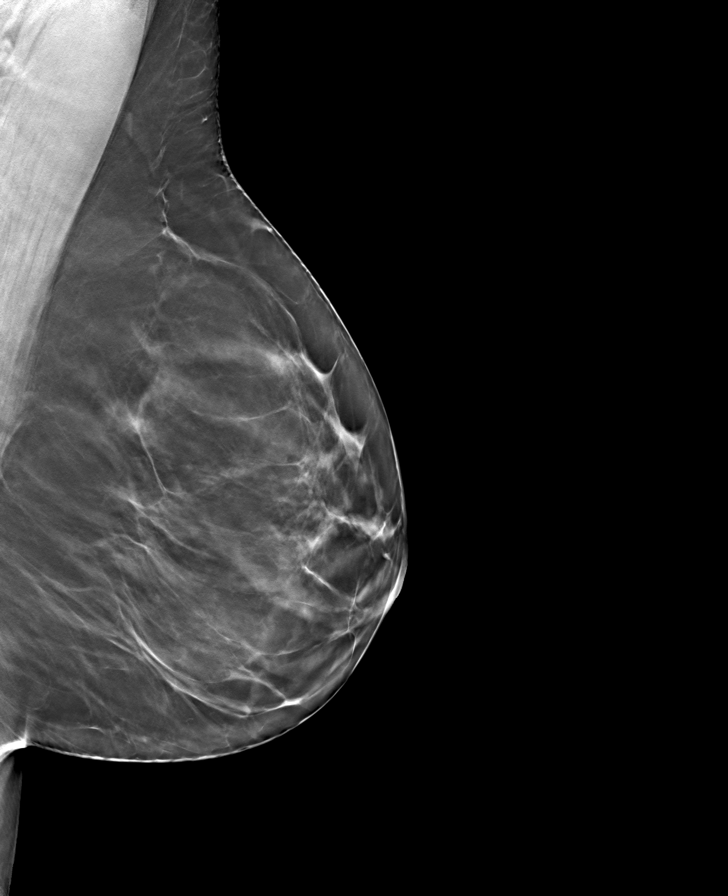

[8 of 24 positions shown; findings below may reference images not displayed]

ACR Breast Density Category b: There are scattered areas of
fibroglandular density.
FINDINGS: There are no findings suspicious for malignancy.
IMPRESSION: No mammographic evidence of malignancy. A result letter of this
screening mammogram will be mailed directly to the patient.

RECOMMENDATION:
Screening mammogram in one year. (Code:51-O-LD2)

BI-RADS CATEGORY  1: Negative.

## 2022-06-09 ENCOUNTER — Ambulatory Visit: Payer: Commercial Managed Care - PPO | Admitting: Sports Medicine

## 2022-06-16 ENCOUNTER — Ambulatory Visit: Payer: Commercial Managed Care - PPO | Admitting: Sports Medicine

## 2022-07-20 ENCOUNTER — Other Ambulatory Visit (HOSPITAL_COMMUNITY): Payer: Self-pay | Admitting: Family

## 2022-07-20 DIAGNOSIS — Z1231 Encounter for screening mammogram for malignant neoplasm of breast: Secondary | ICD-10-CM

## 2022-07-27 ENCOUNTER — Encounter (HOSPITAL_COMMUNITY): Payer: Self-pay

## 2022-07-27 ENCOUNTER — Ambulatory Visit (HOSPITAL_COMMUNITY)
Admission: RE | Admit: 2022-07-27 | Discharge: 2022-07-27 | Disposition: A | Discharge status: Home / Self Care | Payer: Commercial Managed Care - PPO | Source: Ambulatory Visit | Attending: Family | Admitting: Family

## 2022-07-27 DIAGNOSIS — Z1231 Encounter for screening mammogram for malignant neoplasm of breast: Secondary | ICD-10-CM | POA: Insufficient documentation

## 2022-11-05 ENCOUNTER — Other Ambulatory Visit: Payer: Self-pay | Admitting: Family

## 2022-11-05 DIAGNOSIS — E039 Hypothyroidism, unspecified: Secondary | ICD-10-CM

## 2022-11-13 ENCOUNTER — Telehealth (INDEPENDENT_AMBULATORY_CARE_PROVIDER_SITE_OTHER): Payer: Commercial Managed Care - PPO | Admitting: Family

## 2022-11-13 ENCOUNTER — Encounter: Payer: Self-pay | Admitting: Family

## 2022-11-13 DIAGNOSIS — E039 Hypothyroidism, unspecified: Secondary | ICD-10-CM

## 2022-11-13 MED ORDER — SYNTHROID 125 MCG PO TABS: 125.0000 ug | ORAL_TABLET | Freq: Every day | ORAL | 2 refills | Status: DC

## 2022-11-13 NOTE — Progress Notes (Signed)
Virtual Visit Consent   Michelle Delgado, you are scheduled for a virtual visit with a [facility] provider today. Just as with appointments in the office, your consent must be obtained to participate. Your consent will be active for this visit and any virtual visit you may have with one of our providers in the next 365 days. If you have a MyChart account, a copy of this consent can be sent to you electronically.  As this is a virtual visit, video technology does not allow for your provider to perform a traditional examination. This may limit your provider's ability to fully assess your condition. If your provider identifies any concerns that need to be evaluated in person or the need to arrange testing (such as labs, EKG, etc.), we will make arrangements to do so. Although advances in technology are sophisticated, we cannot ensure that it will always work on either your end or our end. If the connection with a video visit is poor, the visit may have to be switched to a telephone visit. With either a video or telephone visit, we are not always able to ensure that we have a secure connection.  By engaging in this virtual visit, you consent to the provision of healthcare and authorize for your insurance to be billed (if applicable) for the services provided during this visit. Depending on your insurance coverage, you may receive a charge related to this service.  I need to obtain your verbal consent now. Are you willing to proceed with your visit today? Michelle Delgado has provided verbal consent on 11/13/2022 for a virtual visit (video or telephone). Jannifer Rodney, FNP  Date: 11/13/2022 11:37 AM  Virtual Visit via Video Note   I, Jannifer Rodney, connected with  Michelle Delgado  ([id number], 12-05-2021) on 11/13/22 at 11:10 AM EDT by a video-enabled telemedicine application and verified that I am speaking with the correct person using two identifiers.  Location: Patient: Virtual Visit Location  Patient: Other: work Provider: Pharmacist, community: Home Office   I discussed the limitations of evaluation and management by telemedicine and the availability of in person appointments. The patient expressed understanding and agreed to proceed.    History of Present Illness: Michelle Delgado is a 47 y.o. who identifies as a female who was assigned female at birth, and is being seen today for she lab work completed at her work and her TSH was slightly abnormal.  HPI: Thyroid Problem Presents for follow-up visit. Symptoms include constipation, fatigue and hair loss. Patient reports no dry skin or hoarse voice. The symptoms have been stable.    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   Depression, major, single episode, mild (HCC) 11/14/2019   Panic attack 08/19/2017   Mood disorder (HCC) 09/03/2015   Hypothyroidism 08/16/2015   History of anemia 08/16/2015   Chronic headache 05/19/2009    Allergies:  Allergies  Allergen Reactions   Sulfa Antibiotics Rash and Hives   Medications:  Current Outpatient Medications:    SYNTHROID 125 MCG tablet, Take 1 tablet (125 mcg total) by mouth daily before breakfast., Disp: 90 tablet, Rfl: 2   busPIRone (BUSPAR) 7.5 MG tablet, Take 1 tablet (7.5 mg total) by mouth 3 (three) times daily as needed., Disp: 90 tablet, Rfl: 1   norethindrone-ethinyl estradiol-FE (LOESTRIN FE)  1-20 MG-MCG tablet, Take 1 tablet by mouth daily., Disp: 90 tablet, Rfl: 1  Observations/Objective: Patient is well-developed, well-nourished in no acute distress.  Resting comfortably .  Head is normocephalic, atraumatic.  No labored breathing. Speech is clear and coherent with logical content.  Patient is alert and oriented at baseline.   Assessment and Plan: 1. Hypothyroidism,  unspecified type - SYNTHROID 125 MCG tablet; Take 1 tablet (125 mcg total) by mouth daily before breakfast.  Dispense: 90 tablet; Refill: 2  Will increase synthroid to 125 mcg from 112 mg  Recheck in 2 months  Take on empty stomach and 30 mins prior to eating and other medications   Follow Up Instructions: I discussed the assessment and treatment plan with the patient. The patient was provided an opportunity to ask questions and all were answered. The patient agreed with the plan and demonstrated an understanding of the instructions.  A copy of instructions were sent to the patient via MyChart unless otherwise noted below.    The patient was advised to call back or seek an in-person evaluation if the symptoms worsen or if the condition fails to improve as anticipated.    Jannifer Rodney, FNP

## 2022-11-13 NOTE — Patient Instructions (Signed)

## 2022-12-14 ENCOUNTER — Ambulatory Visit: Payer: Commercial Managed Care - PPO | Admitting: Family

## 2023-07-20 ENCOUNTER — Other Ambulatory Visit (HOSPITAL_COMMUNITY): Payer: Self-pay | Admitting: Family

## 2023-07-20 DIAGNOSIS — Z1231 Encounter for screening mammogram for malignant neoplasm of breast: Secondary | ICD-10-CM

## 2023-07-29 ENCOUNTER — Encounter (HOSPITAL_COMMUNITY)

## 2023-07-29 DIAGNOSIS — Z1231 Encounter for screening mammogram for malignant neoplasm of breast: Secondary | ICD-10-CM

## 2023-08-02 ENCOUNTER — Ambulatory Visit (HOSPITAL_COMMUNITY)
Admission: RE | Admit: 2023-08-02 | Discharge: 2023-08-02 | Disposition: A | Discharge status: Home / Self Care | Source: Ambulatory Visit

## 2023-08-02 DIAGNOSIS — Z1231 Encounter for screening mammogram for malignant neoplasm of breast: Secondary | ICD-10-CM | POA: Insufficient documentation

## 2023-08-15 ENCOUNTER — Other Ambulatory Visit: Payer: Self-pay | Admitting: Family

## 2023-08-15 DIAGNOSIS — E039 Hypothyroidism, unspecified: Secondary | ICD-10-CM

## 2023-09-15 ENCOUNTER — Encounter (INDEPENDENT_AMBULATORY_CARE_PROVIDER_SITE_OTHER): Payer: Self-pay

## 2023-09-15 DIAGNOSIS — E039 Hypothyroidism, unspecified: Secondary | ICD-10-CM

## 2023-09-16 MED ORDER — SYNTHROID 112 MCG PO TABS: 112.0000 ug | ORAL_TABLET | Freq: Every day | ORAL | 1 refills | Status: AC

## 2023-09-16 MED ORDER — LEVOTHYROXINE SODIUM 112 MCG PO TABS: 112.0000 ug | ORAL_TABLET | Freq: Every day | ORAL | 3 refills | Status: DC

## 2023-09-16 NOTE — Addendum Note (Signed)
 Addended by: LAVELL LYE A on: 09/16/2023 01:29 PM   Modules accepted: Orders

## 2023-09-16 NOTE — Telephone Encounter (Signed)
 Hello, I decreased your levothyroxine  to 112 mcg from 125 mcg. Keep your appointment in Oct for use to recheck.    Bari Learn, FNP  Approximately 5 minutes was spent documenting and reviewing patient's chart.

## 2023-09-24 ENCOUNTER — Telehealth (INDEPENDENT_AMBULATORY_CARE_PROVIDER_SITE_OTHER): Admitting: Family

## 2023-09-24 DIAGNOSIS — F411 Generalized anxiety disorder: Secondary | ICD-10-CM | POA: Diagnosis not present

## 2023-09-24 MED ORDER — BUSPIRONE HCL 10 MG PO TABS: 10.0000 mg | ORAL_TABLET | Freq: Three times a day (TID) | ORAL | 2 refills | Status: AC | PRN

## 2023-09-24 NOTE — Progress Notes (Signed)
 Virtual Visit Consent   Michelle Delgado, you are scheduled for a virtual visit with a [facility] provider today. Just as with appointments in the office, your consent must be obtained to participate. Your consent will be active for this visit and any virtual visit you may have with one of our providers in the next 365 days. If you have a MyChart account, a copy of this consent can be sent to you electronically.  As this is a virtual visit, video technology does not allow for your provider to perform a traditional examination. This may limit your provider's ability to fully assess your condition. If your provider identifies any concerns that need to be evaluated in person or the need to arrange testing (such as labs, EKG, etc.), we will make arrangements to do so. Although advances in technology are sophisticated, we cannot ensure that it will always work on either your end or our end. If the connection with a video visit is poor, the visit may have to be switched to a telephone visit. With either a video or telephone visit, we are not always able to ensure that we have a secure connection.  By engaging in this virtual visit, you consent to the provision of healthcare and authorize for your insurance to be billed (if applicable) for the services provided during this visit. Depending on your insurance coverage, you may receive a charge related to this service.  I need to obtain your verbal consent now. Are you willing to proceed with your visit today? Michelle Delgado has provided verbal consent on 09/24/2023 for a virtual visit (video or telephone). Bari Learn, FNP  Date: 09/24/2023 9:00 AM   Virtual Visit via Video Note   I, Bari Learn, connected with  Michelle Delgado  ([id number], December 18, 1975) on 09/24/23 at  8:40 AM EDT by a video-enabled telemedicine application and verified that I am speaking with the correct person using two identifiers.  Location: Patient: Virtual Visit Location Patient:  Other: work Provider: Pharmacist, community: Home Office   I discussed the limitations of evaluation and management by telemedicine and the availability of in person appointments. The patient expressed understanding and agreed to proceed.    History of Present Illness: Michelle Delgado is a 48 y.o. who identifies as a female who was assigned female at birth, and is being seen today for increase anxiety. Reports her daughter was diagnosed with Type 1 diabetes. Having a lot of stress.   HPI: Anxiety Presents for follow-up visit. Symptoms include excessive worry, insomnia, nervous/anxious behavior and restlessness. Symptoms occur occasionally. The severity of symptoms is moderate.      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   Depression, major, single episode, mild (HCC) 11/14/2019   Panic attack 08/19/2017   Mood disorder (HCC) 09/03/2015   Hypothyroidism 08/16/2015   History of anemia 08/16/2015   Chronic headache 05/19/2009    Allergies:  Allergies  Allergen Reactions   Sulfa Antibiotics Rash and Hives   Medications:  Current Outpatient Medications:    busPIRone  (BUSPAR ) 10 MG tablet, Take 1 tablet (10 mg total) by mouth 3 (three) times daily as needed., Disp: 90 tablet, Rfl: 2   norethindrone-ethinyl estradiol -FE (LOESTRIN FE) 1-20 MG-MCG tablet, Take 1 tablet by mouth daily., Disp: 90 tablet, Rfl: 1   SYNTHROID   112 MCG tablet, Take 1 tablet (112 mcg total) by mouth daily before breakfast., Disp: 90 tablet, Rfl: 1  Observations/Objective: Patient is well-developed, well-nourished in no acute distress.  Resting comfortably  Head is normocephalic, atraumatic.  No labored breathing.  Speech is clear and coherent with logical content.  Patient is alert and oriented at baseline.     Assessment and Plan: 1. GAD (generalized anxiety disorder) (Primary) - busPIRone  (BUSPAR ) 10 MG tablet; Take 1 tablet (10 mg total) by mouth 3 (three) times daily as needed.  Dispense: 90 tablet; Refill: 2  Will restart Buspar  as needed. She can take 5-10 mg TID Prn Stress management  Keep chronic follow up next month   Follow Up Instructions: I discussed the assessment and treatment plan with the patient. The patient was provided an opportunity to ask questions and all were answered. The patient agreed with the plan and demonstrated an understanding of the instructions.  A copy of instructions were sent to the patient via MyChart unless otherwise noted below.     The patient was advised to call back or seek an in-person evaluation if the symptoms worsen or if the condition fails to improve as anticipated.    Bari Learn, FNP

## 2023-10-12 ENCOUNTER — Ambulatory Visit: Admitting: Family

## 2023-11-05 ENCOUNTER — Encounter: Admitting: Family

## 2023-12-03 ENCOUNTER — Encounter: Payer: Self-pay | Admitting: Family

## 2024-01-06 ENCOUNTER — Encounter: Admitting: Family

## 2024-02-18 ENCOUNTER — Encounter: Admitting: Family

## 2024-03-10 ENCOUNTER — Encounter: Admitting: Family
